# Patient Record
Sex: Female | Born: 1980 | Race: Black or African American | Hispanic: No | Marital: Married | Smoking: Never smoker
Health system: Southern US, Community
[De-identification: ages and names within clinical notes are randomized; demographics above are authoritative.]

## PROBLEM LIST (undated history)

## (undated) ENCOUNTER — Inpatient Hospital Stay (HOSPITAL_COMMUNITY): Payer: Self-pay

## (undated) DIAGNOSIS — R000 Tachycardia, unspecified: Secondary | ICD-10-CM

## (undated) DIAGNOSIS — O24419 Gestational diabetes mellitus in pregnancy, unspecified control: Secondary | ICD-10-CM

## (undated) DIAGNOSIS — I10 Essential (primary) hypertension: Secondary | ICD-10-CM

## (undated) DIAGNOSIS — D72819 Decreased white blood cell count, unspecified: Secondary | ICD-10-CM

## (undated) DIAGNOSIS — R42 Dizziness and giddiness: Secondary | ICD-10-CM

## (undated) DIAGNOSIS — O139 Gestational [pregnancy-induced] hypertension without significant proteinuria, unspecified trimester: Secondary | ICD-10-CM

## (undated) HISTORY — DX: Decreased white blood cell count, unspecified: D72.819

## (undated) HISTORY — PX: NO PAST SURGERIES: SHX2092

---

## 2001-09-08 ENCOUNTER — Emergency Department (HOSPITAL_COMMUNITY): Admission: EM | Admit: 2001-09-08 | Discharge: 2001-09-08 | Payer: Self-pay | Admitting: Emergency Medicine

## 2002-09-03 ENCOUNTER — Emergency Department (HOSPITAL_COMMUNITY): Admission: EM | Admit: 2002-09-03 | Discharge: 2002-09-04 | Payer: Self-pay | Admitting: Emergency Medicine

## 2003-06-20 ENCOUNTER — Emergency Department (HOSPITAL_COMMUNITY): Admission: EM | Admit: 2003-06-20 | Discharge: 2003-06-21 | Payer: Self-pay | Admitting: Emergency Medicine

## 2003-12-07 ENCOUNTER — Emergency Department (HOSPITAL_COMMUNITY): Admission: EM | Admit: 2003-12-07 | Discharge: 2003-12-07 | Payer: Self-pay | Admitting: Emergency Medicine

## 2004-12-25 ENCOUNTER — Emergency Department (HOSPITAL_COMMUNITY): Admission: EM | Admit: 2004-12-25 | Discharge: 2004-12-25 | Payer: Self-pay | Admitting: Emergency Medicine

## 2005-01-05 ENCOUNTER — Emergency Department (HOSPITAL_COMMUNITY): Admission: EM | Admit: 2005-01-05 | Discharge: 2005-01-05 | Payer: Self-pay | Admitting: Emergency Medicine

## 2005-02-23 ENCOUNTER — Ambulatory Visit (HOSPITAL_COMMUNITY): Admission: RE | Admit: 2005-02-23 | Discharge: 2005-02-23 | Payer: Self-pay | Admitting: Chiropractic Medicine

## 2005-11-30 ENCOUNTER — Emergency Department (HOSPITAL_COMMUNITY): Admission: EM | Admit: 2005-11-30 | Discharge: 2005-11-30 | Payer: Self-pay | Admitting: Emergency Medicine

## 2007-11-14 ENCOUNTER — Emergency Department (HOSPITAL_COMMUNITY): Admission: EM | Admit: 2007-11-14 | Discharge: 2007-11-14 | Payer: Self-pay | Admitting: Emergency Medicine

## 2008-04-25 ENCOUNTER — Emergency Department (HOSPITAL_COMMUNITY): Admission: EM | Admit: 2008-04-25 | Discharge: 2008-04-25 | Payer: Self-pay | Admitting: Emergency Medicine

## 2008-06-29 ENCOUNTER — Emergency Department (HOSPITAL_COMMUNITY): Admission: EM | Admit: 2008-06-29 | Discharge: 2008-06-29 | Payer: Self-pay | Admitting: Emergency Medicine

## 2008-08-27 ENCOUNTER — Emergency Department (HOSPITAL_COMMUNITY): Admission: EM | Admit: 2008-08-27 | Discharge: 2008-08-27 | Payer: Self-pay | Admitting: Emergency Medicine

## 2008-11-19 ENCOUNTER — Emergency Department (HOSPITAL_COMMUNITY): Admission: EM | Admit: 2008-11-19 | Discharge: 2008-11-20 | Payer: Self-pay | Admitting: Emergency Medicine

## 2008-12-29 ENCOUNTER — Emergency Department (HOSPITAL_COMMUNITY): Admission: EM | Admit: 2008-12-29 | Discharge: 2008-12-29 | Payer: Self-pay | Admitting: Emergency Medicine

## 2009-08-07 ENCOUNTER — Emergency Department (HOSPITAL_COMMUNITY): Admission: EM | Admit: 2009-08-07 | Discharge: 2009-08-07 | Payer: Self-pay | Admitting: Emergency Medicine

## 2009-10-08 ENCOUNTER — Inpatient Hospital Stay (HOSPITAL_COMMUNITY): Admission: AD | Admit: 2009-10-08 | Discharge: 2009-10-08 | Payer: Self-pay | Admitting: Obstetrics and Gynecology

## 2009-10-08 ENCOUNTER — Ambulatory Visit: Payer: Self-pay | Admitting: Gynecology

## 2009-10-08 ENCOUNTER — Ambulatory Visit: Payer: Self-pay | Admitting: Obstetrics and Gynecology

## 2009-10-10 ENCOUNTER — Inpatient Hospital Stay (HOSPITAL_COMMUNITY): Admission: AD | Admit: 2009-10-10 | Discharge: 2009-10-10 | Payer: Self-pay | Admitting: Obstetrics & Gynecology

## 2009-10-10 ENCOUNTER — Ambulatory Visit: Payer: Self-pay | Admitting: Nurse Practitioner

## 2009-10-17 ENCOUNTER — Ambulatory Visit: Payer: Self-pay | Admitting: Nurse Practitioner

## 2009-10-17 ENCOUNTER — Inpatient Hospital Stay (HOSPITAL_COMMUNITY): Admission: RE | Admit: 2009-10-17 | Discharge: 2009-10-17 | Payer: Self-pay | Admitting: Obstetrics & Gynecology

## 2009-11-08 ENCOUNTER — Ambulatory Visit: Payer: Self-pay | Admitting: Nurse Practitioner

## 2009-12-08 ENCOUNTER — Inpatient Hospital Stay (HOSPITAL_COMMUNITY): Admission: AD | Admit: 2009-12-08 | Discharge: 2009-11-08 | Payer: Self-pay | Admitting: Obstetrics & Gynecology

## 2010-03-14 LAB — URINALYSIS, ROUTINE W REFLEX MICROSCOPIC
Bilirubin Urine: NEGATIVE
Glucose, UA: NEGATIVE mg/dL
Hgb urine dipstick: NEGATIVE
Ketones, ur: NEGATIVE mg/dL
Nitrite: NEGATIVE
Protein, ur: NEGATIVE mg/dL
Specific Gravity, Urine: 1.02 (ref 1.005–1.030)
Urobilinogen, UA: 0.2 mg/dL (ref 0.0–1.0)
pH: 7 (ref 5.0–8.0)

## 2010-03-16 LAB — URINALYSIS, ROUTINE W REFLEX MICROSCOPIC
Bilirubin Urine: NEGATIVE
Glucose, UA: NEGATIVE mg/dL
Hgb urine dipstick: NEGATIVE
Ketones, ur: NEGATIVE mg/dL
Nitrite: NEGATIVE
Protein, ur: NEGATIVE mg/dL
Specific Gravity, Urine: 1.01 (ref 1.005–1.030)
Urobilinogen, UA: 0.2 mg/dL (ref 0.0–1.0)
pH: 5.5 (ref 5.0–8.0)

## 2010-03-16 LAB — CBC
HCT: 38.9 % (ref 36.0–46.0)
Hemoglobin: 13.3 g/dL (ref 12.0–15.0)
MCH: 34.1 pg — ABNORMAL HIGH (ref 26.0–34.0)
MCHC: 34.2 g/dL (ref 30.0–36.0)
MCV: 99.7 fL (ref 78.0–100.0)
Platelets: 177 10*3/uL (ref 150–400)
RBC: 3.91 MIL/uL (ref 3.87–5.11)
RDW: 13.2 % (ref 11.5–15.5)
WBC: 6.8 10*3/uL (ref 4.0–10.5)

## 2010-03-16 LAB — WET PREP, GENITAL
Clue Cells Wet Prep HPF POC: NONE SEEN
Trich, Wet Prep: NONE SEEN
Yeast Wet Prep HPF POC: NONE SEEN

## 2010-03-16 LAB — HCG, QUANTITATIVE, PREGNANCY
hCG, Beta Chain, Quant, S: 243 m[IU]/mL — ABNORMAL HIGH (ref <5)
hCG, Beta Chain, Quant, S: 94 m[IU]/mL — ABNORMAL HIGH (ref <5)

## 2010-03-16 LAB — GC/CHLAMYDIA PROBE AMP, GENITAL
Chlamydia, DNA Probe: NEGATIVE
GC Probe Amp, Genital: NEGATIVE

## 2010-03-16 LAB — POCT PREGNANCY, URINE: Preg Test, Ur: POSITIVE

## 2010-03-16 LAB — ABO/RH: ABO/RH(D): O POS

## 2010-03-17 LAB — URINALYSIS, ROUTINE W REFLEX MICROSCOPIC
Bilirubin Urine: NEGATIVE
Glucose, UA: NEGATIVE mg/dL
Ketones, ur: NEGATIVE mg/dL
Nitrite: NEGATIVE
Protein, ur: NEGATIVE mg/dL
Specific Gravity, Urine: 1.025 (ref 1.005–1.030)
Urobilinogen, UA: 1 mg/dL (ref 0.0–1.0)
pH: 6.5 (ref 5.0–8.0)

## 2010-03-17 LAB — URINE CULTURE
Colony Count: NO GROWTH
Culture  Setup Time: 201108080004
Culture: NO GROWTH

## 2010-03-17 LAB — URINE MICROSCOPIC-ADD ON

## 2010-03-17 LAB — POCT PREGNANCY, URINE: Preg Test, Ur: NEGATIVE

## 2010-04-03 LAB — POCT I-STAT, CHEM 8
BUN: 8 mg/dL (ref 6–23)
Calcium, Ion: 1.12 mmol/L (ref 1.12–1.32)
Chloride: 105 mEq/L (ref 96–112)
Creatinine, Ser: 0.6 mg/dL (ref 0.4–1.2)
Glucose, Bld: 126 mg/dL — ABNORMAL HIGH (ref 70–99)
HCT: 40 % (ref 36.0–46.0)
Hemoglobin: 13.6 g/dL (ref 12.0–15.0)
Potassium: 3.3 mEq/L — ABNORMAL LOW (ref 3.5–5.1)
Sodium: 139 mEq/L (ref 135–145)
TCO2: 24 mmol/L (ref 0–100)

## 2010-04-03 LAB — POCT PREGNANCY, URINE: Preg Test, Ur: NEGATIVE

## 2010-04-05 LAB — POCT I-STAT, CHEM 8
BUN: 11 mg/dL (ref 6–23)
Calcium, Ion: 1.16 mmol/L (ref 1.12–1.32)
Chloride: 105 mEq/L (ref 96–112)
Glucose, Bld: 99 mg/dL (ref 70–99)
HCT: 40 % (ref 36.0–46.0)
Potassium: 3.8 mEq/L (ref 3.5–5.1)

## 2010-04-05 LAB — URINALYSIS, ROUTINE W REFLEX MICROSCOPIC
Bilirubin Urine: NEGATIVE
Glucose, UA: NEGATIVE mg/dL
Ketones, ur: NEGATIVE mg/dL
Nitrite: NEGATIVE
Specific Gravity, Urine: 1.008 (ref 1.005–1.030)
pH: 8 (ref 5.0–8.0)

## 2010-04-05 LAB — PREGNANCY, URINE: Preg Test, Ur: NEGATIVE

## 2010-04-08 LAB — URINALYSIS, ROUTINE W REFLEX MICROSCOPIC
Glucose, UA: NEGATIVE mg/dL
Ketones, ur: NEGATIVE mg/dL
Nitrite: NEGATIVE
Specific Gravity, Urine: 1.019 (ref 1.005–1.030)
pH: 6 (ref 5.0–8.0)

## 2010-04-08 LAB — URINE MICROSCOPIC-ADD ON

## 2010-04-08 LAB — URINE CULTURE

## 2010-04-08 LAB — POCT PREGNANCY, URINE: Preg Test, Ur: NEGATIVE

## 2010-04-10 LAB — URINALYSIS, ROUTINE W REFLEX MICROSCOPIC
Nitrite: POSITIVE — AB
Specific Gravity, Urine: 1.026 (ref 1.005–1.030)
Urobilinogen, UA: 4 mg/dL — ABNORMAL HIGH (ref 0.0–1.0)
pH: 6.5 (ref 5.0–8.0)

## 2010-04-10 LAB — URINE MICROSCOPIC-ADD ON

## 2010-04-10 LAB — URINE CULTURE

## 2010-04-10 LAB — POCT PREGNANCY, URINE: Preg Test, Ur: NEGATIVE

## 2010-05-17 ENCOUNTER — Other Ambulatory Visit: Payer: Self-pay | Admitting: Obstetrics

## 2010-06-16 ENCOUNTER — Inpatient Hospital Stay (HOSPITAL_COMMUNITY)
Admission: AD | Admit: 2010-06-16 | Discharge: 2010-06-16 | Disposition: A | Discharge status: Home / Self Care | Payer: 59 | Source: Ambulatory Visit | Attending: Obstetrics & Gynecology | Admitting: Obstetrics & Gynecology

## 2010-06-16 DIAGNOSIS — O479 False labor, unspecified: Secondary | ICD-10-CM | POA: Insufficient documentation

## 2010-06-17 ENCOUNTER — Inpatient Hospital Stay (HOSPITAL_COMMUNITY)
Admission: AD | Admit: 2010-06-17 | Discharge: 2010-06-17 | Disposition: A | Discharge status: Home / Self Care | Payer: 59 | Source: Ambulatory Visit | Attending: Obstetrics & Gynecology | Admitting: Obstetrics & Gynecology

## 2010-06-17 DIAGNOSIS — O479 False labor, unspecified: Secondary | ICD-10-CM | POA: Insufficient documentation

## 2010-06-18 ENCOUNTER — Inpatient Hospital Stay (HOSPITAL_COMMUNITY)
Admission: AD | Admit: 2010-06-18 | Discharge: 2010-06-20 | DRG: 774 | Disposition: A | Discharge status: Home / Self Care | Payer: 59 | Source: Ambulatory Visit | Attending: Obstetrics & Gynecology | Admitting: Obstetrics & Gynecology

## 2010-06-18 DIAGNOSIS — O1002 Pre-existing essential hypertension complicating childbirth: Principal | ICD-10-CM | POA: Diagnosis present

## 2010-06-18 DIAGNOSIS — O99814 Abnormal glucose complicating childbirth: Secondary | ICD-10-CM | POA: Diagnosis present

## 2010-06-18 LAB — COMPREHENSIVE METABOLIC PANEL
ALT: 10 U/L (ref 0–35)
AST: 23 U/L (ref 0–37)
Albumin: 2.9 g/dL — ABNORMAL LOW (ref 3.5–5.2)
Calcium: 10 mg/dL (ref 8.4–10.5)
Chloride: 95 mEq/L — ABNORMAL LOW (ref 96–112)
Creatinine, Ser: 0.6 mg/dL (ref 0.50–1.10)
Sodium: 126 mEq/L — ABNORMAL LOW (ref 135–145)
Total Bilirubin: 0.5 mg/dL (ref 0.3–1.2)

## 2010-06-18 LAB — CBC
MCV: 84.2 fL (ref 78.0–100.0)
Platelets: 255 10*3/uL (ref 150–400)
RDW: 18.6 % — ABNORMAL HIGH (ref 11.5–15.5)
WBC: 12.2 10*3/uL — ABNORMAL HIGH (ref 4.0–10.5)

## 2010-06-18 LAB — GLUCOSE, CAPILLARY: Glucose-Capillary: 113 mg/dL — ABNORMAL HIGH (ref 70–99)

## 2010-06-18 LAB — RPR: RPR Ser Ql: NONREACTIVE

## 2010-06-19 LAB — GLUCOSE, CAPILLARY: Glucose-Capillary: 101 mg/dL — ABNORMAL HIGH (ref 70–99)

## 2010-06-19 LAB — CBC
Platelets: 193 10*3/uL (ref 150–400)
RBC: 2.78 MIL/uL — ABNORMAL LOW (ref 3.87–5.11)
WBC: 20.2 10*3/uL — ABNORMAL HIGH (ref 4.0–10.5)

## 2010-06-26 NOTE — H&P (Signed)
  Penny Morrison             ACCOUNT NO.:  [id number]  MEDICAL RECORD NO.:  [id number]  LOCATION:  9114                          FACILITY:  [facility]  PHYSICIAN:  Penny Morrison, Penny MorrisonDATE OF BIRTH:  1980-09-13  DATE OF ADMISSION:  06/18/2010 DATE OF DISCHARGE:                             HISTORY & PHYSICAL   CHIEF COMPLAINT:  The patient is a 30 year old para 0 with an estimated date of confinement of June 18, 2010, complaining of contractions.  HISTORY OF PRESENT ILLNESS:  Please see the above.  The patient has had contractions for several days.  She has had multiple presentations to the maternity admissions unit.  On previous exams, the cervix has been 1 cm dilated.  She denies rupture of membranes.  ALLERGIES:  No known drug allergies.  MEDICATIONS:  Prenatal vitamins.  OB RISK FACTORS:  Chronic hypertension.  Gestational diabetes, oral agents.  PRENATAL LABORATORY DATA:  In January 2012, SGPT 81, SGOT 66, creatinine clearance 165 mL/minute, January 2012.  A 2-hour GTT normal.  Hepatitis B surface antigen negative.  Hematocrit 32.4, hemoglobin 10.3, HIV nonreactive.  Quad screen negative.  Platelets 205,000.  Blood type is O positive.  Antibody screen negative.  RPR is nonreactive, rubella immune.  Sickle cell negative.  Uric acid 3.2 on January 03, 2010.  Urine culture and sensitivity no growth.  PAST GYNECOLOGIC HISTORY:  Noncontributory.  PAST MEDICAL HISTORY:  Please see the above.  PAST SURGICAL HISTORY:  No previous surgery.  SOCIAL HISTORY:  She is unemployed, married, living with her spouse. Does not give any significant history of alcohol usage, has no significant smoking history.  Denies illicit drug use.  FAMILY HISTORY:  Remarkable for adult-onset diabetes, hypertension, hypercholesterolemia.  REVIEW OF SYSTEMS:  GU:  Please see the above.  PHYSICAL EXAMINATION:  VITAL SIGNS:  Stable, afebrile.  Fetal heart tracing baseline 140, moderate  long-term variability.  Tocodynamometer, uterine contractions every 2-4 minutes. GENERAL:  Moderate distress. ABDOMEN:  Gravid.  Sterile vaginal exam per the RN, the cervix is 6 cm dilated.  ASSESSMENT:  Nullipara at term, pregnancy complicated by history of chronic hypertension and gestational diabetes on oral agent.  Category 1, fetal heart tracing, active labor.  PLAN:  Admission, we will check CBG, to continue the oral labetalol. Monitor progress, anticipate a spontaneous vaginal delivery.     Penny Morrison, M.D.     Penny Morrison  D:  06/18/2010  T:  06/18/2010  Job:  [id number]  Electronically Signed by Penny Char M.D. on 06/26/2010 10:51:08 AM

## 2010-10-03 LAB — URINALYSIS, ROUTINE W REFLEX MICROSCOPIC
Ketones, ur: NEGATIVE
Nitrite: NEGATIVE
Protein, ur: NEGATIVE
Urobilinogen, UA: 0.2

## 2010-10-03 LAB — POCT I-STAT, CHEM 8
BUN: 11
Chloride: 104
Sodium: 139
TCO2: 22

## 2010-10-03 LAB — PREGNANCY, URINE: Preg Test, Ur: NEGATIVE

## 2010-10-03 LAB — DIFFERENTIAL
Basophils Absolute: 0
Eosinophils Relative: 2
Lymphocytes Relative: 49 — ABNORMAL HIGH
Neutro Abs: 3.1

## 2010-10-03 LAB — CBC
Platelets: 166
RDW: 13.1

## 2010-10-24 ENCOUNTER — Emergency Department (HOSPITAL_COMMUNITY)
Admission: EM | Admit: 2010-10-24 | Discharge: 2010-10-24 | Disposition: A | Discharge status: Home / Self Care | Payer: 59 | Attending: Emergency Medicine | Admitting: Emergency Medicine

## 2010-10-24 DIAGNOSIS — R55 Syncope and collapse: Secondary | ICD-10-CM | POA: Insufficient documentation

## 2010-10-24 LAB — URINALYSIS, ROUTINE W REFLEX MICROSCOPIC
Bilirubin Urine: NEGATIVE
Nitrite: NEGATIVE
Specific Gravity, Urine: 1.002 — ABNORMAL LOW (ref 1.005–1.030)
Urobilinogen, UA: 0.2 mg/dL (ref 0.0–1.0)
pH: 7 (ref 5.0–8.0)

## 2010-10-24 LAB — POCT PREGNANCY, URINE: Preg Test, Ur: NEGATIVE

## 2010-10-24 LAB — URINE MICROSCOPIC-ADD ON

## 2010-10-27 LAB — URINE CULTURE: Culture  Setup Time: 201210240147

## 2011-04-03 DIAGNOSIS — I498 Other specified cardiac arrhythmias: Secondary | ICD-10-CM | POA: Insufficient documentation

## 2011-04-04 ENCOUNTER — Encounter (HOSPITAL_COMMUNITY): Payer: Self-pay

## 2011-04-04 ENCOUNTER — Emergency Department (HOSPITAL_COMMUNITY)
Admission: EM | Admit: 2011-04-04 | Discharge: 2011-04-04 | Disposition: A | Discharge status: Home / Self Care | Payer: 59 | Attending: Emergency Medicine | Admitting: Emergency Medicine

## 2011-04-04 ENCOUNTER — Other Ambulatory Visit: Payer: Self-pay

## 2011-04-04 DIAGNOSIS — R000 Tachycardia, unspecified: Secondary | ICD-10-CM

## 2011-04-04 HISTORY — DX: Dizziness and giddiness: R42

## 2011-04-04 LAB — BASIC METABOLIC PANEL
CO2: 26 mEq/L (ref 19–32)
Chloride: 101 mEq/L (ref 96–112)
Creatinine, Ser: 0.72 mg/dL (ref 0.50–1.10)
GFR calc Af Amer: >90 mL/min (ref >90)
Potassium: 3.7 mEq/L (ref 3.5–5.1)
Sodium: 135 mEq/L (ref 135–145)

## 2011-04-04 LAB — TSH: TSH: 1.324 u[IU]/mL (ref 0.350–4.500)

## 2011-04-04 NOTE — ED Notes (Signed)
Pt presents with a c/c of tachycardia.  Pt st's she had a scare earlier and ever since then her heart rate has been elevated.  Pt's heart doctor (Dr. Jacinto Halim) told her to come back in and have an EKG done the next time she becomes tachycardic.  Pt is in sinus tach.  No SOB, no chest pain, no dizziness, no lightheadedness, cap refill < 3 seconds.

## 2011-04-04 NOTE — Discharge Instructions (Signed)
Please follow up with your cardiologist.  Avoid caffeine and other stimulants.  Return to the ER for chest pain, shortness of breath, or other concerning symptoms.  Tachycardia, Nonspecific In adults, the heart normally beats between 60 and 100 times a minute. A heart rate over 100 is called tachycardia. When your heart beats too fast, it may not be able to pump enough blood to the rest of the body. CAUSES   Exercise or exertion.   Fever.   Pain or injury.   Infection.   Loss of fluid (dehydration).   Overactive thyroid.   Lack of red blood cells (anemia).   Anxiety.   Alcohol.   Heart arrhythmia.   Caffeine.   Tobacco products.   Diet pills.   Street drugs.   Heart disease.  SYMPTOMS  Palpitations (rapid or irregular heartbeat).   Dizziness.   Tiredness (fatigue).   Shortness of breath.  DIAGNOSIS  After an exam and taking a history, your caregiver may order:  Blood tests.   Electrocardiogram (EKG).   Heart monitor.  TREATMENT  Treatment will depend on the cause and potential for harm. It may include:  Intravenous (IV) replacement of fluids or blood.   Antidote or reversal medicines.   Changes in your present medicines.   Lifestyle changes.  HOME CARE INSTRUCTIONS   Get rest.   Drink enough water and fluids to keep your urine clear or pale yellow.   Avoid:   Caffeine.   Nicotine.   Alcohol.   Stress.   Chocolate.   Stimulants.   Only take medicine as directed by your caregiver.  SEEK IMMEDIATE MEDICAL CARE IF:   You have pain in your chest, upper arms, jaw, or neck.   You become weak, dizzy, or feel faint.   You have palpitations that will not go away.   You throw up (vomit), have diarrhea, or pass blood.   You look pale and your skin is cool and wet.  MAKE SURE YOU:   Understand these instructions.   Will watch your condition.   Will get help right away if you are not doing well or get worse.  Document Released:  01/26/2004 Document Revised: 12/07/2010 Document Reviewed: 11/28/2010 [facility] Patient Information 2012 [place], [place].

## 2011-04-04 NOTE — ED Notes (Signed)
Pt said that something scared her but then an hour later her heart was still racing, she states that shes had tachycardia before

## 2011-04-04 NOTE — ED Provider Notes (Signed)
History     CSN: [id number]  Arrival date & time 04/03/11  2357   First MD Initiated Contact with Patient 04/04/11 0147      Chief Complaint  Patient presents with  . Tachycardia    (Consider location/radiation/quality/duration/timing/severity/associated sxs/prior treatment) HPI 31 year old female presents emergency department with report of fast heart rate. Patient reports earlier this evening she was backing up and hit something. Patient reports being scared by this but noticed some time later after she had calmed down but she still had a racing heartbeat. Patient has been seen for this in the past, is followed by Dr. Jacinto Halim for same. Patient denies any shortness of breath chest pain leg swelling oral contraceptive use history of blood clotting disorders or PE. Patient has had outpatient workup for her tachycardia, but was told to return to her cardiologist if it returned for Holter monitoring. Patient otherwise asymptomatic without any complaints. Patient denies being anxious or panicky at this time Past Medical History  Diagnosis Date  . Vertigo     History reviewed. No pertinent past surgical history.  History reviewed. No pertinent family history.  History  Substance Use Topics  . Smoking status: Not on file  . Smokeless tobacco: Not on file  . Alcohol Use: No    OB History    Grav Para Term Preterm Abortions TAB SAB Ect Mult Living                  Review of Systems  All other systems reviewed and are negative.   other than listed in history of present illness  Allergies  Review of patient's allergies indicates no known allergies.  Home Medications   Current Outpatient Rx  Name Route Sig Dispense Refill  . MECLIZINE HCL 25 MG PO TABS Oral Take 25 mg by mouth 3 (three) times daily as needed.      BP 129/86  Pulse 110  Temp(Src) 97.8 F (36.6 C) (Oral)  Resp 25  Wt 130 lb (58.968 kg)  SpO2 100%  LMP 03/22/2011  Physical Exam  Nursing note and vitals  reviewed. Constitutional: She is oriented to person, place, and time. She appears well-developed and well-nourished.  HENT:  Head: Normocephalic and atraumatic.  Nose: Nose normal.  Mouth/Throat: Oropharynx is clear and moist.  Eyes: Conjunctivae and EOM are normal. Pupils are equal, round, and reactive to light.  Neck: Normal range of motion. Neck supple. No JVD present. No tracheal deviation present. No thyromegaly present.  Cardiovascular: Regular rhythm, normal heart sounds and intact distal pulses.  Exam reveals no gallop and no friction rub.   No murmur heard.      Tachycardia noted  Pulmonary/Chest: Effort normal and breath sounds normal. No stridor. No respiratory distress. She has no wheezes. She has no rales. She exhibits no tenderness.  Abdominal: Soft. Bowel sounds are normal. She exhibits no distension and no mass. There is no tenderness. There is no rebound and no guarding.  Musculoskeletal: Normal range of motion. She exhibits no edema and no tenderness.  Lymphadenopathy:    She has no cervical adenopathy.  Neurological: She is oriented to person, place, and time. She exhibits normal muscle tone. Coordination normal.  Skin: Skin is dry. No rash noted. No erythema. No pallor.  Psychiatric: She has a normal mood and affect. Her behavior is normal. Judgment and thought content normal.    ED Course  Procedures (including critical care time)  Labs Reviewed  BASIC METABOLIC PANEL - Abnormal; Notable for  the following:    Glucose, Bld 108 (*)    All other components within normal limits  TSH   No results found.  Date: 04/04/2011  Rate: 116  Rhythm: sinus tachycardia  QRS Axis: normal  Intervals: PR prolonged  ST/T Wave abnormalities: nonspecific T wave changes  Conduction Disutrbances:first-degree A-V block   Narrative Interpretation: early repolarization  Old EKG Reviewed: changes noted. Rate has increased   1. Sinus tachycardia       MDM  31 year old female  with sinus tachycardia without other symptoms. Do not feel this is secondary to PE. Patient has close followup establish with cardiology. We'll get baseline labs, and have her followup instructed        Olivia Mackie, MD 04/04/11 [phone]

## 2011-05-04 ENCOUNTER — Other Ambulatory Visit: Payer: Self-pay | Admitting: Obstetrics

## 2011-06-15 ENCOUNTER — Ambulatory Visit
Admission: RE | Admit: 2011-06-15 | Discharge: 2011-06-15 | Disposition: A | Discharge status: Home / Self Care | Payer: 59 | Source: Ambulatory Visit | Attending: Family Medicine | Admitting: Family Medicine

## 2011-06-15 ENCOUNTER — Other Ambulatory Visit: Payer: Self-pay | Admitting: Family Medicine

## 2011-06-15 DIAGNOSIS — M542 Cervicalgia: Secondary | ICD-10-CM

## 2011-07-23 ENCOUNTER — Other Ambulatory Visit (HOSPITAL_COMMUNITY)
Admission: RE | Admit: 2011-07-23 | Discharge: 2011-07-23 | Disposition: A | Discharge status: Home / Self Care | Payer: 59 | Source: Ambulatory Visit | Attending: Family Medicine | Admitting: Family Medicine

## 2011-07-23 DIAGNOSIS — Z0000 Encounter for general adult medical examination without abnormal findings: Secondary | ICD-10-CM | POA: Insufficient documentation

## 2012-01-31 ENCOUNTER — Inpatient Hospital Stay (HOSPITAL_COMMUNITY)
Admission: AD | Admit: 2012-01-31 | Discharge: 2012-01-31 | Disposition: A | Discharge status: Home / Self Care | Payer: 59 | Source: Ambulatory Visit | Attending: Obstetrics & Gynecology | Admitting: Obstetrics & Gynecology

## 2012-01-31 ENCOUNTER — Encounter (HOSPITAL_COMMUNITY): Payer: Self-pay | Admitting: *Deleted

## 2012-01-31 ENCOUNTER — Inpatient Hospital Stay (HOSPITAL_COMMUNITY): Payer: 59

## 2012-01-31 DIAGNOSIS — N946 Dysmenorrhea, unspecified: Secondary | ICD-10-CM | POA: Insufficient documentation

## 2012-01-31 DIAGNOSIS — N920 Excessive and frequent menstruation with regular cycle: Secondary | ICD-10-CM | POA: Insufficient documentation

## 2012-01-31 DIAGNOSIS — R1032 Left lower quadrant pain: Secondary | ICD-10-CM | POA: Insufficient documentation

## 2012-01-31 HISTORY — DX: Gestational diabetes mellitus in pregnancy, unspecified control: O24.419

## 2012-01-31 HISTORY — DX: Tachycardia, unspecified: R00.0

## 2012-01-31 LAB — WET PREP, GENITAL: Clue Cells Wet Prep HPF POC: NONE SEEN

## 2012-01-31 LAB — CBC
HCT: 36.7 % (ref 36.0–46.0)
Hemoglobin: 12.1 g/dL (ref 12.0–15.0)
MCHC: 33 g/dL (ref 30.0–36.0)
RDW: 13 % (ref 11.5–15.5)
WBC: 5 10*3/uL (ref 4.0–10.5)

## 2012-01-31 LAB — URINALYSIS, ROUTINE W REFLEX MICROSCOPIC
Bilirubin Urine: NEGATIVE
Leukocytes, UA: NEGATIVE
Nitrite: NEGATIVE
Specific Gravity, Urine: 1.01 (ref 1.005–1.030)
Urobilinogen, UA: 0.2 mg/dL (ref 0.0–1.0)
pH: 6 (ref 5.0–8.0)

## 2012-01-31 LAB — URINE MICROSCOPIC-ADD ON

## 2012-01-31 MED ORDER — NORGESTIMATE-ETH ESTRADIOL 0.25-35 MG-MCG PO TABS: 1.0000 | ORAL_TABLET | Freq: Every day | ORAL | Status: DC

## 2012-01-31 NOTE — MAU Note (Signed)
Pt states for past months and a half has had stabbing pain into her lower abdomen, had cycle at end of December, came on again on 01/10/2012 through 01/15/2012, then started bleeding again yesterday. Notes intermittent clear vaginal discharge with no odor.

## 2012-01-31 NOTE — MAU Provider Note (Signed)
History     CSN: [id number]  Arrival date and time: 01/31/12 1318   First Provider Initiated Contact with Patient 01/31/12 1403      No chief complaint on file.  HPI 32 y.o. G1P1001 presents with 2 months of LLQ stabing pain with shortened length of time between her last three cycles.  Her cycles are usually 28 days apart but she has had her period recently start on Dec. 22nd, Jan 9th, and Jan 29.  Bleeding has been normal compared to her usual cycle bleeding.  Denies any other vaginal discharge, foul odor, change in bowel habit, urinary frequency or dysuria, feeling of pelvic fullness, abnormal hair growth, dizziness, fever, or chills.  Last sexual activity was yesterday with some mild painful deep penetration.  She does state that she has been under some abnormal stress lately with a death in her family.   OB History    Grav Para Term Preterm Abortions TAB SAB Ect Mult Living   1 1 1       1       Past Medical History  Diagnosis Date  . Vertigo   . Tachycardia   . Gestational diabetes     Past Surgical History  Procedure Date  . No past surgeries     History reviewed. No pertinent family history.  History  Substance Use Topics  . Smoking status: Never Smoker   . Smokeless tobacco: Not on file  . Alcohol Use: No    Allergies: No Known Allergies  Prescriptions prior to admission  Medication Sig Dispense Refill  . acebutolol (SECTRAL) 200 MG capsule Take 200 mg by mouth at bedtime.      Marland Kitchen acetaminophen-codeine (TYLENOL #3) 300-30 MG per tablet Take 1 tablet by mouth every 4 (four) hours as needed. For pain      . ALPRAZolam (XANAX) 0.25 MG tablet Take 0.25 mg by mouth daily as needed. For anxiety      . cholecalciferol (VITAMIN D) 1000 UNITS tablet Take 1,000 Units by mouth at bedtime.      . Omega-3 Fatty Acids (FISH OIL) 1200 MG CAPS Take 1 capsule by mouth at bedtime.      . meclizine (ANTIVERT) 25 MG tablet Take 25 mg by mouth 3 (three) times daily as needed.         Review of Systems  Constitutional: Negative.  Negative for fever and chills.  HENT: Negative.   Eyes: Negative.   Respiratory: Negative.   Cardiovascular: Negative.   Gastrointestinal: Positive for abdominal pain. Negative for diarrhea, constipation and blood in stool.  Genitourinary: Negative for dysuria, urgency and frequency.  Musculoskeletal: Negative.   Skin: Negative.  Negative for itching and rash.  Neurological: Negative.  Negative for dizziness and headaches.  Endo/Heme/Allergies: Negative.   Psychiatric/Behavioral: Negative.    Physical Exam   Pulse 94, temperature 97.8 F (36.6 C), temperature source Oral, resp. rate 18, height 5\' 5"  (1.651 m), weight 55.792 kg (123 lb), last menstrual period 01/30/2012.  Physical Exam  Constitutional: She appears well-developed and well-nourished. No distress.  Eyes: Conjunctivae normal and EOM are normal. Pupils are equal, round, and reactive to light.  Neck: Normal range of motion. Neck supple.  Cardiovascular: Normal rate, regular rhythm, normal heart sounds and intact distal pulses.   Respiratory: Effort normal and breath sounds normal.  GI: Soft. Bowel sounds are normal. She exhibits no distension and no mass. There is no tenderness. There is no rebound and no guarding.  Genitourinary: There is  no rash, tenderness, lesion or injury on the right labia. There is no rash, tenderness, lesion or injury on the left labia. Uterus is not deviated, not enlarged, not fixed and not tender. Cervix exhibits no motion tenderness, no discharge and no friability. Right adnexum displays tenderness. Right adnexum displays no mass and no fullness. Left adnexum displays no mass, no tenderness and no fullness. There is bleeding (menses) around the vagina. No erythema or tenderness around the vagina. No foreign body around the vagina. No signs of injury around the vagina. No vaginal discharge found.   Results for orders placed during the hospital  encounter of 01/31/12 (from the past 24 hour(s))  URINALYSIS, ROUTINE W REFLEX MICROSCOPIC     Status: Abnormal   Collection Time   01/31/12  1:32 PM      Component Value Range   Color, Urine YELLOW  YELLOW   APPearance CLEAR  CLEAR   Specific Gravity, Urine 1.010  1.005 - 1.030   pH 6.0  5.0 - 8.0   Glucose, UA NEGATIVE  NEGATIVE mg/dL   Hgb urine dipstick LARGE (*) NEGATIVE   Bilirubin Urine NEGATIVE  NEGATIVE   Ketones, ur NEGATIVE  NEGATIVE mg/dL   Protein, ur NEGATIVE  NEGATIVE mg/dL   Urobilinogen, UA 0.2  0.0 - 1.0 mg/dL   Nitrite NEGATIVE  NEGATIVE   Leukocytes, UA NEGATIVE  NEGATIVE  URINE MICROSCOPIC-ADD ON     Status: Abnormal   Collection Time   01/31/12  1:32 PM      Component Value Range   Squamous Epithelial / LPF FEW (*) RARE   RBC / HPF 11-20  <3 RBC/hpf   Bacteria, UA RARE  RARE  POCT PREGNANCY, URINE     Status: Normal   Collection Time   01/31/12  2:03 PM      Component Value Range   Preg Test, Ur NEGATIVE  NEGATIVE  WET PREP, GENITAL     Status: Abnormal   Collection Time   01/31/12  2:43 PM      Component Value Range   Yeast Wet Prep HPF POC NONE SEEN  NONE SEEN   Trich, Wet Prep NONE SEEN  NONE SEEN   Clue Cells Wet Prep HPF POC NONE SEEN  NONE SEEN   WBC, Wet Prep HPF POC FEW (*) NONE SEEN  CBC     Status: Abnormal   Collection Time   01/31/12  4:23 PM      Component Value Range   WBC 5.0  4.0 - 10.5 K/uL   RBC 3.83 (*) 3.87 - 5.11 MIL/uL   Hemoglobin 12.1  12.0 - 15.0 g/dL   HCT 14.7  82.9 - 56.2 %   MCV 95.8  78.0 - 100.0 fL   MCH 31.6  26.0 - 34.0 pg   MCHC 33.0  30.0 - 36.0 g/dL   RDW 13.0  86.5 - 78.4 %   Platelets 191  150 - 400 K/uL   US Transvaginal Non-ob  01/31/2012  *RADIOLOGY REPORT*  Clinical Data: Irregular menses with left lower quadrant pain.  LMP 01/30/2012  TRANSABDOMINAL AND TRANSVAGINAL ULTRASOUND OF PELVIS Technique:  Both transabdominal and transvaginal ultrasound examinations of the pelvis were performed. Transabdominal  technique was performed for global imaging of the pelvis including uterus, ovaries, adnexal regions, and pelvic cul-de-sac.  It was necessary to proceed with endovaginal exam following the transabdominal exam to visualize the adnexa.  Comparison:  None  Findings:  Uterus: Is anteverted and anteflexed and has  a sagittal length of 7.7 cm, depth of 4.5 cm and width of 5.5 cm.  A homogeneous myometrium is seen  Endometrium: Is thin with a width of 4.1 mm.  No areas of focal thickening or heterogeneity are identified. This would correlate with a postsecretory endometrial stripe and would correspond with the provided LMP of 01/30/2012.  Right ovary:  Measures 2.7 x 3.2 x 3.0 cm and has a normal appearance.  Flow is identified within the stroma with color Doppler assessment  Left ovary: Measures 2.9 x 1.5 x 3.5 cm and has a normal appearance. Flow is identified within the ovarian stroma with color Doppler assessment  Other findings: No pelvic fluid or separate adnexal masses are seen.  IMPRESSION: Normal postsecretory pelvic ultrasound.   Original Report Authenticated By: Rhodia Albright, M.D.    US Pelvis Complete  01/31/2012  *RADIOLOGY REPORT*  Clinical Data: Irregular menses with left lower quadrant pain.  LMP 01/30/2012  TRANSABDOMINAL AND TRANSVAGINAL ULTRASOUND OF PELVIS Technique:  Both transabdominal and transvaginal ultrasound examinations of the pelvis were performed. Transabdominal technique was performed for global imaging of the pelvis including uterus, ovaries, adnexal regions, and pelvic cul-de-sac.  It was necessary to proceed with endovaginal exam following the transabdominal exam to visualize the adnexa.  Comparison:  None  Findings:  Uterus: Is anteverted and anteflexed and has a sagittal length of 7.7 cm, depth of 4.5 cm and width of 5.5 cm.  A homogeneous myometrium is seen  Endometrium: Is thin with a width of 4.1 mm.  No areas of focal thickening or heterogeneity are identified. This would  correlate with a postsecretory endometrial stripe and would correspond with the provided LMP of 01/30/2012.  Right ovary:  Measures 2.7 x 3.2 x 3.0 cm and has a normal appearance.  Flow is identified within the stroma with color Doppler assessment  Left ovary: Measures 2.9 x 1.5 x 3.5 cm and has a normal appearance. Flow is identified within the ovarian stroma with color Doppler assessment  Other findings: No pelvic fluid or separate adnexal masses are seen.  IMPRESSION: Normal postsecretory pelvic ultrasound.   Original Report Authenticated By: Rhodia Albright, M.D.    MAU Course  Procedures UcG Pelvic exam, bi-manual - rt adnexal pain with no mass palpated Pelvic ultrasound   Assessment and Plan  A: 1. Dysmenorrhea   2. Unusually frequent menses    P: D/C home Discussed variations in menstrual cycle with pt r/t hormonal changes, stress, etc.   Ibuprofen OTC for pain Sprintec 28 prescribed with 2 refills F/U with primary care provider and/or Gyn of pt choice (pt mentioned having preferred Gyn) Return to MAU as needed   Henningsgaard, Elige Radon 01/31/2012, 2:34 PM   I have seen this patient and agree with the above resident's note.  Pain with exam mild, onset 2 months ago without change, and no s/sx of infection with exam or labs.   LEFTWICH-KIRBY, Kathyann Spaugh Certified Nurse-Midwife

## 2012-02-01 LAB — GC/CHLAMYDIA PROBE AMP
CT Probe RNA: NEGATIVE
GC Probe RNA: NEGATIVE

## 2012-08-13 ENCOUNTER — Other Ambulatory Visit: Payer: Self-pay | Admitting: Family Medicine

## 2012-08-13 ENCOUNTER — Other Ambulatory Visit (HOSPITAL_COMMUNITY)
Admission: RE | Admit: 2012-08-13 | Discharge: 2012-08-13 | Disposition: A | Discharge status: Home / Self Care | Payer: 59 | Source: Ambulatory Visit | Attending: Family Medicine | Admitting: Family Medicine

## 2012-08-13 DIAGNOSIS — Z0000 Encounter for general adult medical examination without abnormal findings: Secondary | ICD-10-CM | POA: Insufficient documentation

## 2012-10-10 ENCOUNTER — Other Ambulatory Visit: Payer: Self-pay | Admitting: Obstetrics and Gynecology

## 2012-10-10 ENCOUNTER — Other Ambulatory Visit (HOSPITAL_COMMUNITY)
Admission: RE | Admit: 2012-10-10 | Discharge: 2012-10-10 | Disposition: A | Discharge status: Home / Self Care | Payer: 59 | Source: Ambulatory Visit | Attending: Obstetrics and Gynecology | Admitting: Obstetrics and Gynecology

## 2012-10-10 DIAGNOSIS — Z01419 Encounter for gynecological examination (general) (routine) without abnormal findings: Secondary | ICD-10-CM | POA: Insufficient documentation

## 2012-10-10 DIAGNOSIS — Z1151 Encounter for screening for human papillomavirus (HPV): Secondary | ICD-10-CM | POA: Insufficient documentation

## 2012-10-22 ENCOUNTER — Telehealth: Payer: Self-pay | Admitting: Hematology and Oncology

## 2012-10-22 NOTE — Telephone Encounter (Signed)
C/D 10/22/12 for appt. 11/10

## 2012-10-22 NOTE — Telephone Encounter (Signed)
lvom for pt to return call in re to referral.  °

## 2012-10-22 NOTE — Telephone Encounter (Signed)
PT RETURN CALL TO SCHEDULE NP APPT 11/10 @ 9:30 W/DR. GORSUCH REFERRING DR. CAMMIE FULP  DX-MILD CHRONIC LEUKOPENIA WELCOME PACKET MAILED.

## 2012-11-10 ENCOUNTER — Ambulatory Visit (HOSPITAL_BASED_OUTPATIENT_CLINIC_OR_DEPARTMENT_OTHER): Payer: 59 | Admitting: Lab

## 2012-11-10 ENCOUNTER — Telehealth: Payer: Self-pay | Admitting: Hematology and Oncology

## 2012-11-10 ENCOUNTER — Encounter: Payer: Self-pay | Admitting: Hematology and Oncology

## 2012-11-10 ENCOUNTER — Ambulatory Visit: Payer: 59

## 2012-11-10 ENCOUNTER — Ambulatory Visit (HOSPITAL_BASED_OUTPATIENT_CLINIC_OR_DEPARTMENT_OTHER): Payer: 59 | Admitting: Hematology and Oncology

## 2012-11-10 ENCOUNTER — Other Ambulatory Visit (HOSPITAL_COMMUNITY)
Admission: RE | Admit: 2012-11-10 | Discharge: 2012-11-10 | Disposition: A | Discharge status: Home / Self Care | Payer: 59 | Source: Ambulatory Visit | Attending: Hematology and Oncology | Admitting: Hematology and Oncology

## 2012-11-10 VITALS — BP 140/92 | HR 87 | Temp 97.7°F | Resp 18 | Wt 122.6 lb

## 2012-11-10 DIAGNOSIS — D72819 Decreased white blood cell count, unspecified: Secondary | ICD-10-CM | POA: Insufficient documentation

## 2012-11-10 HISTORY — DX: Decreased white blood cell count, unspecified: D72.819

## 2012-11-10 LAB — SEDIMENTATION RATE: Sed Rate: 6 mm/hr (ref 0–22)

## 2012-11-10 LAB — HEPATITIS B SURFACE ANTIBODY,QUALITATIVE: Hep B S Ab: POSITIVE — AB

## 2012-11-10 LAB — CBC WITH DIFFERENTIAL/PLATELET
Basophils Absolute: 0 10*3/uL (ref 0.0–0.1)
Eosinophils Absolute: 0.1 10*3/uL (ref 0.0–0.5)
HCT: 40 % (ref 34.8–46.6)
LYMPH%: 45.7 % (ref 14.0–49.7)
MCV: 97.7 fL (ref 79.5–101.0)
MONO%: 5.9 % (ref 0.0–14.0)
NEUT#: 1.7 10*3/uL (ref 1.5–6.5)
NEUT%: 44.2 % (ref 38.4–76.8)
Platelets: 194 10*3/uL (ref 145–400)
RBC: 4.09 10*6/uL (ref 3.70–5.45)

## 2012-11-10 LAB — COMPREHENSIVE METABOLIC PANEL (CC13)
Albumin: 3.9 g/dL (ref 3.5–5.0)
Anion Gap: 8 mEq/L (ref 3–11)
BUN: 8.3 mg/dL (ref 7.0–26.0)
CO2: 21 mEq/L — ABNORMAL LOW (ref 22–29)
Calcium: 9.6 mg/dL (ref 8.4–10.4)
Chloride: 110 mEq/L — ABNORMAL HIGH (ref 98–109)
Glucose: 88 mg/dl (ref 70–140)
Potassium: 3.9 mEq/L (ref 3.5–5.1)
Sodium: 140 mEq/L (ref 136–145)
Total Protein: 8.2 g/dL (ref 6.4–8.3)

## 2012-11-10 LAB — HEPATITIS B SURFACE ANTIGEN: Hepatitis B Surface Ag: NEGATIVE

## 2012-11-10 NOTE — Progress Notes (Signed)
[facility] CONSULT NOTE  Patient Care Team: Cammie Fulp as PCP - General (Family Medicine) Artis Delay, MD as Consulting Physician (Hematology and Oncology)  CHIEF COMPLAINTS/PURPOSE OF CONSULTATION:  Leukopenia  HISTORY OF PRESENTING ILLNESS:  Penny Morrison 32 y.o. female is here because of leukopenia. I had the opportunity to review her CBC dated back 2 years ago. Her white blood cell count range is from 3.3-6 She denies any history of recurrent infection. She does have history urinary tract infection on and off. Last antibiotic therapy was 6 months ago. She denies any prior diagnosis of autoimmune disease. She's never been transfused before. Denies any atypical infection such as shingles, meningitis, or pneumonia requiring hospitalization  MEDICAL HISTORY:  Past Medical History  Diagnosis Date  . Vertigo   . Tachycardia   . Gestational diabetes   . Leukopenia   . Leukocytopenia, unspecified 11/10/2012    SURGICAL HISTORY: Past Surgical History  Procedure Laterality Date  . No past surgeries      SOCIAL HISTORY: History   Social History  . Marital Status: Married    Spouse Name: N/A    Number of Children: N/A  . Years of Education: N/A   Occupational History  . Not on file.   Social History Main Topics  . Smoking status: Never Smoker   . Smokeless tobacco: Never Used  . Alcohol Use: No  . Drug Use: No  . Sexual Activity: Yes    Birth Control/ Protection: None   Other Topics Concern  . Not on file   Social History Narrative  . No narrative on file    FAMILY HISTORY: Family History  Problem Relation Age of Onset  . Cancer Maternal Grandmother     colon ca    ALLERGIES:  has No Known Allergies.  MEDICATIONS:  Current Outpatient Prescriptions  Medication Sig Dispense Refill  . acebutolol (SECTRAL) 200 MG capsule Take 200 mg by mouth at bedtime.      Marland Kitchen acetaminophen-codeine (TYLENOL #3) 300-30 MG per tablet Take 1 tablet by  mouth every 4 (four) hours as needed. For pain      . ALPRAZolam (XANAX) 0.25 MG tablet Take 0.25 mg by mouth daily as needed. For anxiety      . cholecalciferol (VITAMIN D) 1000 UNITS tablet Take 1,000 Units by mouth at bedtime.      . Linaclotide (LINZESS PO) Take by mouth daily as needed.      . meclizine (ANTIVERT) 25 MG tablet Take 25 mg by mouth 3 (three) times daily as needed.      . medroxyPROGESTERone (DEPO-PROVERA) 150 MG/ML injection Inject 150 mg into the muscle every 3 (three) months.      . Omega-3 Fatty Acids (FISH OIL) 1200 MG CAPS Take 1 capsule by mouth at bedtime.       No current facility-administered medications for this visit.    REVIEW OF SYSTEMS:   Constitutional: Denies fevers, chills or abnormal night sweats Eyes: Denies blurriness of vision, double vision or watery eyes Ears, nose, mouth, throat, and face: Denies mucositis or sore throat Respiratory: Denies cough, dyspnea or wheezes Cardiovascular: Denies palpitation, chest discomfort or lower extremity swelling Gastrointestinal:  Denies nausea, heartburn or change in bowel habits Skin: Denies abnormal skin rashes Lymphatics: Denies new lymphadenopathy or easy bruising Neurological:Denies numbness, tingling or new weaknesses Behavioral/Psych: Mood is stable, no new changes  All other systems were reviewed with the patient and are negative.  PHYSICAL EXAMINATION: ECOG PERFORMANCE STATUS:  0 - Asymptomatic  Filed Vitals:   11/10/12 1024  BP: 140/92  Pulse: 87  Temp: 97.7 F (36.5 C)  Resp: 18   Filed Weights   11/10/12 1024  Weight: 122 lb 9.6 oz (55.611 kg)    GENERAL:alert, no distress and comfortable SKIN: skin color, texture, turgor are normal, no rashes or significant lesions EYES: normal, conjunctiva are pink and non-injected, sclera clear OROPHARYNX:no exudate, no erythema and lips, buccal mucosa, and tongue normal  NECK: supple, thyroid normal size, non-tender, without nodularity LYMPH:  no  palpable lymphadenopathy in the cervical, axillary or inguinal LUNGS: clear to auscultation and percussion with normal breathing effort HEART: regular rate & rhythm and no murmurs and no lower extremity edema ABDOMEN:abdomen soft, non-tender and normal bowel sounds Musculoskeletal:no cyanosis of digits and no clubbing  PSYCH: alert & oriented x 3 with fluent speech NEURO: no focal motor/sensory deficits  LABORATORY DATA:  I have reviewed the data as listed  ASSESSMENT:  Leukopenia  PLAN:  #1 recurrent leukopenia Really, this could be due to her African American heritage. I think it is prudent to rule out autoimmune disease, infection, or leukemia. I will go ahead and order blood work and see her back in 2 weeks to review test results. In the meantime, the patient is asymptomatic. I reassured the patient not to worry as her abnormal blood work dated back 2 years ago Orders Placed This Encounter  Procedures  . CBC with Differential    Standing Status: Future     Number of Occurrences:      Standing Expiration Date: 08/02/2013  . Sedimentation rate    Standing Status: Future     Number of Occurrences:      Standing Expiration Date: 11/10/2013  . ANA w/Reflex if Positive    Standing Status: Future     Number of Occurrences:      Standing Expiration Date: 11/10/2013  . Smear    Standing Status: Future     Number of Occurrences:      Standing Expiration Date: 11/10/2013  . Vitamin B12    Standing Status: Future     Number of Occurrences:      Standing Expiration Date: 11/10/2013  . Comprehensive metabolic panel    Standing Status: Future     Number of Occurrences:      Standing Expiration Date: 11/10/2013  . Hepatitis B surface antibody    Standing Status: Future     Number of Occurrences:      Standing Expiration Date: 11/10/2013  . Hepatitis B surface antigen    Standing Status: Future     Number of Occurrences:      Standing Expiration Date: 11/10/2013  . Hepatitis B  core antibody, IgM    Standing Status: Future     Number of Occurrences:      Standing Expiration Date: 11/10/2013  . HIV antibody    Standing Status: Future     Number of Occurrences:      Standing Expiration Date: 11/10/2013  . Hepatitis C antibody    Standing Status: Future     Number of Occurrences:      Standing Expiration Date: 11/10/2013  . Flow Cytometry    R/o CLL, lymphocytosis on outside labs    Standing Status: Future     Number of Occurrences:      Standing Expiration Date: 11/10/2013    All questions were answered. The patient knows to call the clinic with any problems, questions or concerns.  I spent 25 minutes counseling the patient face to face. The total time spent in the appointment was 40 minutes and more than 50% was on counseling.     Autumn Pruitt, MD 11/10/2012 10:48 AM

## 2012-11-10 NOTE — Telephone Encounter (Signed)
per 11/10 POF added on lab for today and made appt for NG in 2 wks AVS and cal given shh

## 2012-11-10 NOTE — Progress Notes (Signed)
Checked in new pt with no financial concerns. °

## 2012-11-17 ENCOUNTER — Telehealth: Payer: Self-pay | Admitting: *Deleted

## 2012-11-17 NOTE — Telephone Encounter (Signed)
Pt states saw her lab results on My Chart.  She is concerned her Hep B showed positive and her Vit B12 was elevated.  She asks if Dr. Bertis Ruddy can explain what this means for pt and how it affects her.

## 2012-11-17 NOTE — Telephone Encounter (Signed)
I can review with her but if she calls again, high B12 is normal and Hep B positive was due to prior vaccination

## 2012-11-17 NOTE — Telephone Encounter (Signed)
Informed pt of Dr. Gorsuch's reply below.  She verbalized understanding.  

## 2012-11-24 ENCOUNTER — Ambulatory Visit (HOSPITAL_BASED_OUTPATIENT_CLINIC_OR_DEPARTMENT_OTHER): Payer: 59 | Admitting: Hematology and Oncology

## 2012-11-24 ENCOUNTER — Telehealth: Payer: Self-pay | Admitting: Hematology and Oncology

## 2012-11-24 VITALS — BP 133/85 | HR 87 | Temp 98.4°F | Resp 18 | Ht 65.0 in | Wt 124.5 lb

## 2012-11-24 DIAGNOSIS — D72819 Decreased white blood cell count, unspecified: Secondary | ICD-10-CM

## 2012-11-24 NOTE — Progress Notes (Signed)
[facility] OFFICE PROGRESS NOTE  FULP, CAMMIE, MD DIAGNOSIS:   Chronic leukopenia   SUMMARY OF HEMATOLOGIC HISTORY: This is a pleasant 32 year old lady who is being referred here because of chronic leukopenia. INTERVAL HISTORY: Penny Morrison 32 y.o. female returns for further followup. She denies any recent infection. She denies any recent fever, chills, night sweats or abnormal weight loss  I have reviewed the past medical history, past surgical history, social history and family history with the patient and they are unchanged from previous note.  ALLERGIES:  has No Known Allergies.  MEDICATIONS:  Current Outpatient Prescriptions  Medication Sig Dispense Refill  . acebutolol (SECTRAL) 200 MG capsule Take 200 mg by mouth at bedtime.      Marland Kitchen acetaminophen-codeine (TYLENOL #3) 300-30 MG per tablet Take 1 tablet by mouth every 4 (four) hours as needed. For pain      . ALPRAZolam (XANAX) 0.25 MG tablet Take 0.25 mg by mouth daily as needed. For anxiety      . cholecalciferol (VITAMIN D) 1000 UNITS tablet Take 1,000 Units by mouth at bedtime.      . Linaclotide (LINZESS PO) Take by mouth daily as needed.      . meclizine (ANTIVERT) 25 MG tablet Take 25 mg by mouth 3 (three) times daily as needed.      . medroxyPROGESTERone (DEPO-PROVERA) 150 MG/ML injection Inject 150 mg into the muscle every 3 (three) months.      . Omega-3 Fatty Acids (FISH OIL) 1200 MG CAPS Take 1 capsule by mouth at bedtime.       No current facility-administered medications for this visit.     REVIEW OF SYSTEMS:   All other systems were reviewed with the patient and are negative.  PHYSICAL EXAMINATION: ECOG PERFORMANCE STATUS: 0 - Asymptomatic  Filed Vitals:   11/24/12 0945  BP: 133/85  Pulse: 87  Temp: 98.4 F (36.9 C)  Resp: 18   Filed Weights   11/24/12 0945  Weight: 124 lb 8 oz (56.473 kg)    GENERAL:alert, no distress and comfortable NEURO: alert & oriented x 3 with fluent  speech, no focal motor/sensory deficits  LABORATORY DATA:  I have reviewed the data as listed No results found for this or any previous visit (from the past 48 hour(s)).  Lab Results  Component Value Date   WBC 3.8* 11/10/2012   HGB 13.2 11/10/2012   HCT 40.0 11/10/2012   MCV 97.7 11/10/2012   PLT 194 11/10/2012   ASSESSMENT & PLAN:  #1 chronic leukopenia I've ordered multiple tests to rule out autoimmune disease, viral infections, and leukemia. I reassured the patient all the test results came back normal. It is likely she may be exposed to some sort of unknown viral infection that caused profound leukopenia. The patient is not neutropenic. I recommend observation only and bring her back in 6 months with repeat blood work. I reassured the patient at this point in time no further testing is needed. There is no role of doing a bone marrow aspirate and biopsy at this point as the patient is asymptomatic.  All questions were answered. The patient knows to call the clinic with any problems, questions or concerns. No barriers to learning was detected.  I spent 15 minutes counseling the patient face to face. The total time spent in the appointment was 20 minutes and more than 50% was on counseling.     Thoren Hosang, MD 11/24/2012 9:59 AM
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
## 2012-12-11 ENCOUNTER — Encounter: Payer: Self-pay | Admitting: Hematology and Oncology

## 2013-05-28 ENCOUNTER — Other Ambulatory Visit: Payer: 59

## 2013-05-28 ENCOUNTER — Ambulatory Visit: Payer: 59 | Admitting: Hematology and Oncology

## 2013-05-28 ENCOUNTER — Telehealth: Payer: Self-pay | Admitting: Hematology and Oncology

## 2013-05-28 NOTE — Telephone Encounter (Signed)
returned pts call and s/w pt re new appt for 6/2.

## 2013-06-02 ENCOUNTER — Other Ambulatory Visit: Payer: 59

## 2013-06-02 ENCOUNTER — Ambulatory Visit: Payer: 59 | Admitting: Hematology and Oncology

## 2013-10-16 ENCOUNTER — Other Ambulatory Visit: Payer: Self-pay

## 2013-11-02 ENCOUNTER — Encounter: Payer: Self-pay | Admitting: Hematology and Oncology

## 2015-02-18 ENCOUNTER — Other Ambulatory Visit (HOSPITAL_COMMUNITY)
Admission: RE | Admit: 2015-02-18 | Discharge: 2015-02-18 | Disposition: A | Discharge status: Home / Self Care | Payer: Managed Care, Other (non HMO) | Source: Ambulatory Visit | Attending: Family Medicine | Admitting: Family Medicine

## 2015-02-18 ENCOUNTER — Other Ambulatory Visit: Payer: Self-pay | Admitting: Family Medicine

## 2015-02-18 DIAGNOSIS — Z01419 Encounter for gynecological examination (general) (routine) without abnormal findings: Secondary | ICD-10-CM | POA: Diagnosis present

## 2015-02-22 LAB — CYTOLOGY - PAP

## 2016-07-17 ENCOUNTER — Emergency Department (HOSPITAL_COMMUNITY): Payer: Managed Care, Other (non HMO)

## 2016-07-17 ENCOUNTER — Emergency Department (HOSPITAL_COMMUNITY)
Admission: EM | Admit: 2016-07-17 | Discharge: 2016-07-17 | Disposition: A | Discharge status: Home / Self Care | Payer: Managed Care, Other (non HMO) | Attending: Emergency Medicine | Admitting: Emergency Medicine

## 2016-07-17 ENCOUNTER — Encounter (HOSPITAL_COMMUNITY): Payer: Self-pay | Admitting: Emergency Medicine

## 2016-07-17 DIAGNOSIS — M25512 Pain in left shoulder: Secondary | ICD-10-CM

## 2016-07-17 DIAGNOSIS — Z79899 Other long term (current) drug therapy: Secondary | ICD-10-CM | POA: Insufficient documentation

## 2016-07-17 DIAGNOSIS — G8929 Other chronic pain: Secondary | ICD-10-CM | POA: Insufficient documentation

## 2016-07-17 MED ORDER — IBUPROFEN 600 MG PO TABS: 600.0000 mg | ORAL_TABLET | Freq: Four times a day (QID) | ORAL | 0 refills | Status: DC | PRN

## 2016-07-17 MED ORDER — METHOCARBAMOL 500 MG PO TABS: 500.0000 mg | ORAL_TABLET | Freq: Two times a day (BID) | ORAL | 0 refills | Status: DC
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
## 2016-07-17 NOTE — Discharge Instructions (Signed)
You have been seen today for a shoulder injury. There were no acute abnormalities on the x-rays, including no sign of fracture or dislocation. Pain: Take 600 mg of ibuprofen every 6 hours or 440 mg (over the counter dose) to 500 mg (prescription dose) of naproxen every 12 hours or for the next 3 days. After this time, these medications may be used as needed for pain. Take these medications with food to avoid upset stomach. Choose only one of these medications, do not take them together.  Tylenol: Should you continue to have additional pain while taking the ibuprofen or naproxen, you may add in tylenol as needed. Your daily total maximum amount of tylenol from all sources should be limited to 4000mg /day for persons without liver problems, or 2000mg /day for those with liver problems. Robaxin: Take the Robaxin as needed for muscle spasms. Do not drive or perform other dangerous activities while taking the Robaxin. Ice: May apply ice to the area over the next 24 hours for 15 minutes at a time to reduce swelling. Elevation: Keep the extremity elevated as often as possible to reduce pain and inflammation. Support: Wear the sling for support and comfort. Wear this until pain resolves.  Exercises: Start by performing these exercises a few times a week, increasing the frequency until you are performing them twice daily.  Follow up: Follow up with the orthopedic specialist as soon as possible on this matter. Call the number provided to set up an appointment.
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
## 2017-01-01 ENCOUNTER — Encounter (HOSPITAL_COMMUNITY): Payer: Self-pay

## 2017-01-01 ENCOUNTER — Inpatient Hospital Stay (HOSPITAL_COMMUNITY)
Admission: AD | Admit: 2017-01-01 | Discharge: 2017-01-01 | Disposition: A | Discharge status: Home / Self Care | Payer: Managed Care, Other (non HMO) | Source: Ambulatory Visit | Attending: Obstetrics & Gynecology | Admitting: Obstetrics & Gynecology

## 2017-01-01 DIAGNOSIS — Z3202 Encounter for pregnancy test, result negative: Secondary | ICD-10-CM | POA: Diagnosis not present

## 2017-01-01 DIAGNOSIS — B9689 Other specified bacterial agents as the cause of diseases classified elsewhere: Secondary | ICD-10-CM | POA: Insufficient documentation

## 2017-01-01 DIAGNOSIS — Z8632 Personal history of gestational diabetes: Secondary | ICD-10-CM | POA: Diagnosis not present

## 2017-01-01 DIAGNOSIS — R109 Unspecified abdominal pain: Secondary | ICD-10-CM

## 2017-01-01 DIAGNOSIS — N926 Irregular menstruation, unspecified: Secondary | ICD-10-CM | POA: Insufficient documentation

## 2017-01-01 DIAGNOSIS — N760 Acute vaginitis: Secondary | ICD-10-CM | POA: Diagnosis not present

## 2017-01-01 DIAGNOSIS — Z79899 Other long term (current) drug therapy: Secondary | ICD-10-CM | POA: Diagnosis not present

## 2017-01-01 DIAGNOSIS — F419 Anxiety disorder, unspecified: Secondary | ICD-10-CM | POA: Diagnosis not present

## 2017-01-01 DIAGNOSIS — R1032 Left lower quadrant pain: Secondary | ICD-10-CM | POA: Diagnosis present

## 2017-01-01 DIAGNOSIS — N83202 Unspecified ovarian cyst, left side: Secondary | ICD-10-CM | POA: Diagnosis not present

## 2017-01-01 DIAGNOSIS — N939 Abnormal uterine and vaginal bleeding, unspecified: Secondary | ICD-10-CM

## 2017-01-01 LAB — WET PREP, GENITAL
Sperm: NONE SEEN
TRICH WET PREP: NONE SEEN
Yeast Wet Prep HPF POC: NONE SEEN

## 2017-01-01 LAB — URINALYSIS, ROUTINE W REFLEX MICROSCOPIC
BACTERIA UA: NONE SEEN
Bilirubin Urine: NEGATIVE
GLUCOSE, UA: NEGATIVE mg/dL
Hgb urine dipstick: NEGATIVE
KETONES UR: NEGATIVE mg/dL
Nitrite: NEGATIVE
PH: 6 (ref 5.0–8.0)
Protein, ur: NEGATIVE mg/dL
SPECIFIC GRAVITY, URINE: 1.01 (ref 1.005–1.030)

## 2017-01-01 LAB — POCT PREGNANCY, URINE: Preg Test, Ur: NEGATIVE

## 2017-01-01 MED ORDER — METRONIDAZOLE 0.75 % VA GEL: 1.0000 | Freq: Two times a day (BID) | VAGINAL | 0 refills | Status: DC

## 2017-01-01 NOTE — MAU Note (Signed)
Tenderness on left lower abdomen for a week  Hx of cyst on l ovary  No vaginal bleeding

## 2017-01-01 NOTE — MAU Provider Note (Signed)
History     CSN: [id number]  Arrival date and time: 01/01/17 4098   First Provider Initiated Contact with Patient 01/01/17 [phone]      Chief Complaint  Patient presents with  . Abdominal Pain   HPI Penny Morrison is a 37 y.o. female who presents with abdominal pain x 1 week. Reports LLQ pain that is intermittent & throbbing in nature. When pain occurs, rates 5/10. Currently no pain. Reports hx of ovarian cyst & concerned that is what's causing the pain. Denies fever/chills, n/v/d, constipation, dysuria, vaginal bleeding, dyspareunia, or postcoital bleeding. Patient in monogamous relationship with spouse x 8 years & reports no concern for STI. Last BM was yesterday & normal for her. LMP 12/8; no contraception. Endorses some clear vaginal discharge with slight odor; no vaginal irritation.   Pt reports irregular menses for the last 2 years since a 1 time injection of depo provera. States at times skips periods & sometimes has 2 periods per month. Concerned about fertility. States they have been trying to get pregnant for a month.   Past Medical History:  Diagnosis Date  . Gestational diabetes   . Leukocytopenia, unspecified 11/10/2012  . Leukopenia   . Tachycardia   . Vertigo     Past Surgical History:  Procedure Laterality Date  . NO PAST SURGERIES      Family History  Problem Relation Age of Onset  . Cancer Maternal Grandmother        colon ca    Social History   Tobacco Use  . Smoking status: Never Smoker  . Smokeless tobacco: Never Used  Substance Use Topics  . Alcohol use: No  . Drug use: No    Allergies: No Known Allergies  Medications Prior to Admission  Medication Sig Dispense Refill Last Dose  . acebutolol (SECTRAL) 200 MG capsule Take 200 mg by mouth at bedtime.   Taking  . acetaminophen-codeine (TYLENOL #3) 300-30 MG per tablet Take 1 tablet by mouth every 4 (four) hours as needed. For pain   Taking  . ALPRAZolam (XANAX) 0.25 MG tablet Take 0.25 mg by  mouth daily as needed. For anxiety   Taking  . cholecalciferol (VITAMIN D) 1000 UNITS tablet Take 1,000 Units by mouth at bedtime.   Taking  . ibuprofen (ADVIL,MOTRIN) 600 MG tablet Take 1 tablet (600 mg total) by mouth every 6 (six) hours as needed. 30 tablet 0   . Linaclotide (LINZESS PO) Take by mouth daily as needed.   Taking  . meclizine (ANTIVERT) 25 MG tablet Take 25 mg by mouth 3 (three) times daily as needed.   Taking  . medroxyPROGESTERone (DEPO-PROVERA) 150 MG/ML injection Inject 150 mg into the muscle every 3 (three) months.   Taking  . methocarbamol (ROBAXIN) 500 MG tablet Take 1 tablet (500 mg total) by mouth 2 (two) times daily. 20 tablet 0   . Omega-3 Fatty Acids (FISH OIL) 1200 MG CAPS Take 1 capsule by mouth at bedtime.   Taking    Review of Systems  Constitutional: Negative.   Gastrointestinal: Positive for abdominal pain (none currently). Negative for constipation, diarrhea, nausea and vomiting.  Genitourinary: Positive for vaginal discharge. Negative for dyspareunia, dysuria and vaginal bleeding.   Physical Exam   Blood pressure 118/84, pulse (!) 108, temperature 98.7 F (37.1 C), temperature source Oral, resp. rate 18, last menstrual period 12/08/2016.  Physical Exam  Nursing note and vitals reviewed. Constitutional: She is oriented to person, place, and time. She appears well-developed and  well-nourished. No distress.  HENT:  Head: Normocephalic and atraumatic.  Eyes: Conjunctivae are normal. Right eye exhibits no discharge. Left eye exhibits no discharge. No scleral icterus.  Neck: Normal range of motion.  Respiratory: Effort normal. No respiratory distress.  GI: Soft. Bowel sounds are normal. She exhibits no distension. There is no tenderness.  Genitourinary: Uterus normal. Cervix exhibits no motion tenderness. Right adnexum displays no mass and no tenderness. Left adnexum displays no mass and no tenderness. No bleeding in the vagina. Vaginal discharge found.   Neurological: She is alert and oriented to person, place, and time.  Skin: Skin is warm and dry. She is not diaphoretic.  Psychiatric: She has a normal mood and affect. Her behavior is normal. Judgment and thought content normal.    MAU Course  Procedures Results for orders placed or performed during the hospital encounter of 01/01/17 (from the past 24 hour(s))  Urinalysis, Routine w reflex microscopic     Status: Abnormal   Collection Time: 01/01/17  8:30 AM  Result Value Ref Range   Color, Urine YELLOW YELLOW   APPearance HAZY (A) CLEAR   Specific Gravity, Urine 1.010 1.005 - 1.030   pH 6.0 5.0 - 8.0   Glucose, UA NEGATIVE NEGATIVE mg/dL   Hgb urine dipstick NEGATIVE NEGATIVE   Bilirubin Urine NEGATIVE NEGATIVE   Ketones, ur NEGATIVE NEGATIVE mg/dL   Protein, ur NEGATIVE NEGATIVE mg/dL   Nitrite NEGATIVE NEGATIVE   Leukocytes, UA TRACE (A) NEGATIVE   RBC / HPF 0-5 0 - 5 RBC/hpf   WBC, UA 0-5 0 - 5 WBC/hpf   Bacteria, UA NONE SEEN NONE SEEN   Squamous Epithelial / LPF 6-30 (A) NONE SEEN  Wet prep, genital     Status: Abnormal   Collection Time: 01/01/17  9:06 AM  Result Value Ref Range   Yeast Wet Prep HPF POC NONE SEEN NONE SEEN   Trich, Wet Prep NONE SEEN NONE SEEN   Clue Cells Wet Prep HPF POC PRESENT (A) NONE SEEN   WBC, Wet Prep HPF POC FEW (A) NONE SEEN   Sperm NONE SEEN   Pregnancy, urine POC     Status: None   Collection Time: 01/01/17  9:09 AM  Result Value Ref Range   Preg Test, Ur NEGATIVE NEGATIVE    MDM UPT negative VSS, NAD Currently no pain. No adnexal mass appreciated on bimanual Wet prep collected -- pt declined sti testing  Assessment and Plan  A: 1. BV (bacterial vaginosis)   2. Left lower quadrant pain   3. Pregnancy examination or test, negative result   4. Abnormal uterine bleeding (AUB)    P: Discharge home Rx metrogel Outpatient pelvic ultrasound ordered Msg to [facility] for appt to discuss ultrasound results & DUB Discussed reasons to  return to MAU vs ED   Judeth HornErin Kamia Insalaco 01/01/2017, 8:51 AM

## 2017-01-01 NOTE — Discharge Instructions (Signed)
Dysfunctional Uterine Bleeding °Dysfunctional uterine bleeding is abnormal bleeding from the uterus. Dysfunctional uterine bleeding includes: °· A period that comes earlier or later than usual. °· A period that is lighter, heavier, or has blood clots. °· Bleeding between periods. °· Skipping one or more periods. °· Bleeding after sexual intercourse. °· Bleeding after menopause. ° °Follow these instructions at home: °Pay attention to any changes in your symptoms. Follow these instructions to help with your condition: °Eating and drinking °· Eat well-balanced meals. Include foods that are high in iron, such as liver, meat, shellfish, green leafy vegetables, and eggs. °· If you become constipated: °? Drink plenty of water. °? Eat fruits and vegetables that are high in water and fiber, such as spinach, carrots, raspberries, apples, and mango. °Medicines °· Take over-the-counter and prescription medicines only as told by your health care provider. °· Do not change medicines without talking with your health care provider. °· Aspirin or medicines that contain aspirin may make the bleeding worse. Do not take those medicines: °? During the week before your period. °? During your period. °· If you were prescribed iron pills, take them as told by your health care provider. Iron pills help to replace iron that your body loses because of this condition. °Activity °· If you need to change your sanitary pad or tampon more than one time every 2 hours: °? Lie in bed with your feet raised (elevated). °? Place a cold pack on your lower abdomen. °? Rest as much as possible until the bleeding stops or slows down. °· Do not try to lose weight until the bleeding has stopped and your blood iron level is back to normal. °Other Instructions °· For two months, write down: °? When your period starts. °? When your period ends. °? When any abnormal bleeding occurs. °? What problems you notice. °· Keep all follow up visits as told by your health  care provider. This is important. °Contact a health care provider if: °· You get light-headed or weak. °· You have nausea and vomiting. °· You cannot eat or drink without vomiting. °· You feel dizzy or have diarrhea while you are taking medicines. °· You are taking birth control pills or hormones, and you want to change them or stop taking them. °Get help right away if: °· You develop a fever or chills. °· You need to change your sanitary pad or tampon more than one time per hour. °· Your bleeding becomes heavier, or your flow contains clots more often. °· You develop pain in your abdomen. °· You lose consciousness. °· You develop a rash. °This information is not intended to replace advice given to you by your health care provider. Make sure you discuss any questions you have with your health care provider. °Document Released: 12/16/1999 Document Revised: 05/26/2015 Document Reviewed: 03/15/2014 °Elsevier Interactive Patient Education © 2018 [employer] ° °

## 2017-01-08 ENCOUNTER — Other Ambulatory Visit: Payer: Self-pay

## 2017-01-08 ENCOUNTER — Ambulatory Visit (HOSPITAL_COMMUNITY): Payer: Managed Care, Other (non HMO)

## 2017-01-16 ENCOUNTER — Ambulatory Visit (HOSPITAL_COMMUNITY): Payer: Managed Care, Other (non HMO)

## 2017-01-18 ENCOUNTER — Ambulatory Visit (HOSPITAL_COMMUNITY): Admission: RE | Admit: 2017-01-18 | Payer: Managed Care, Other (non HMO) | Source: Ambulatory Visit

## 2017-01-28 ENCOUNTER — Encounter: Payer: Managed Care, Other (non HMO) | Admitting: Obstetrics & Gynecology

## 2017-09-17 ENCOUNTER — Other Ambulatory Visit: Payer: Self-pay | Admitting: Obstetrics and Gynecology

## 2017-09-17 ENCOUNTER — Other Ambulatory Visit (HOSPITAL_COMMUNITY)
Admission: RE | Admit: 2017-09-17 | Discharge: 2017-09-17 | Disposition: A | Discharge status: Home / Self Care | Payer: Managed Care, Other (non HMO) | Source: Ambulatory Visit | Attending: Obstetrics and Gynecology | Admitting: Obstetrics and Gynecology

## 2017-09-17 DIAGNOSIS — Z01411 Encounter for gynecological examination (general) (routine) with abnormal findings: Secondary | ICD-10-CM | POA: Insufficient documentation

## 2017-09-19 LAB — CYTOLOGY - PAP
Diagnosis: NEGATIVE
HPV: NOT DETECTED

## 2017-10-31 ENCOUNTER — Encounter (HOSPITAL_COMMUNITY): Payer: Self-pay | Admitting: *Deleted

## 2017-10-31 ENCOUNTER — Inpatient Hospital Stay (HOSPITAL_COMMUNITY): Payer: Managed Care, Other (non HMO)

## 2017-10-31 ENCOUNTER — Inpatient Hospital Stay (HOSPITAL_COMMUNITY)
Admission: AD | Admit: 2017-10-31 | Discharge: 2017-10-31 | Disposition: A | Discharge status: Home / Self Care | Payer: Managed Care, Other (non HMO) | Source: Ambulatory Visit | Attending: Obstetrics and Gynecology | Admitting: Obstetrics and Gynecology

## 2017-10-31 ENCOUNTER — Other Ambulatory Visit: Payer: Self-pay

## 2017-10-31 DIAGNOSIS — R10A0 Flank pain, unspecified side: Secondary | ICD-10-CM

## 2017-10-31 DIAGNOSIS — O161 Unspecified maternal hypertension, first trimester: Secondary | ICD-10-CM | POA: Insufficient documentation

## 2017-10-31 DIAGNOSIS — O209 Hemorrhage in early pregnancy, unspecified: Secondary | ICD-10-CM | POA: Diagnosis not present

## 2017-10-31 DIAGNOSIS — O26891 Other specified pregnancy related conditions, first trimester: Secondary | ICD-10-CM

## 2017-10-31 DIAGNOSIS — Z3A01 Less than 8 weeks gestation of pregnancy: Secondary | ICD-10-CM | POA: Insufficient documentation

## 2017-10-31 DIAGNOSIS — Z79899 Other long term (current) drug therapy: Secondary | ICD-10-CM | POA: Insufficient documentation

## 2017-10-31 DIAGNOSIS — R109 Unspecified abdominal pain: Secondary | ICD-10-CM

## 2017-10-31 DIAGNOSIS — R102 Pelvic and perineal pain: Secondary | ICD-10-CM

## 2017-10-31 DIAGNOSIS — O3680X0 Pregnancy with inconclusive fetal viability, not applicable or unspecified: Secondary | ICD-10-CM

## 2017-10-31 HISTORY — DX: Essential (primary) hypertension: I10

## 2017-10-31 LAB — URINALYSIS, ROUTINE W REFLEX MICROSCOPIC
Bilirubin Urine: NEGATIVE
Glucose, UA: NEGATIVE mg/dL
Hgb urine dipstick: NEGATIVE
Ketones, ur: 5 mg/dL — AB
Leukocytes, UA: NEGATIVE
Nitrite: NEGATIVE
Protein, ur: NEGATIVE mg/dL
Specific Gravity, Urine: 1.03 (ref 1.005–1.030)
pH: 7 (ref 5.0–8.0)

## 2017-10-31 LAB — POCT PREGNANCY, URINE: Preg Test, Ur: POSITIVE — AB

## 2017-10-31 NOTE — MAU Note (Signed)
+  HPT on Tuesday.  Has been cramping since last wk, getting more severe..  Just wants to make sure everything is ok.

## 2017-10-31 NOTE — MAU Provider Note (Signed)
History     CSN: [id number]  Arrival date and time: 10/31/17 1540   First Provider Initiated Contact with Patient 10/31/17 1626      Chief Complaint  Patient presents with  . Vaginal Bleeding  . Possible Pregnancy   Penny Morrison presents for positive pregnancy test and abdominal cramping Pain started about a week ago and has become more intense Rates it 7/10 Hasn't tried anything for it at home LMP 09/06/17 ([redacted]w[redacted]d today) Pain located in LLQ Reports every other day bowel movements Pain is constant  No obvious exacerbating factors Has not pursued prenatal care just yet Denies vaginal bleeding    Past Medical History:  Diagnosis Date  . Gestational diabetes   . Hypertension   . Leukocytopenia, unspecified 11/10/2012  . Leukopenia   . Tachycardia   . Vertigo     Past Surgical History:  Procedure Laterality Date  . NO PAST SURGERIES      Family History  Problem Relation Age of Onset  . Cancer Maternal Grandmother        colon ca    Social History   Tobacco Use  . Smoking status: Never Smoker  . Smokeless tobacco: Never Used  Substance Use Topics  . Alcohol use: No  . Drug use: No    Allergies: No Known Allergies  Medications Prior to Admission  Medication Sig Dispense Refill Last Dose  . acebutolol (SECTRAL) 200 MG capsule Take 200 mg by mouth at bedtime.   10/30/2017 at Unknown time  . cholecalciferol (VITAMIN D) 1000 UNITS tablet Take 1,000 Units by mouth at bedtime.   Past Week at Unknown time  . Prenatal MV-Min-Fe Fum-FA-DHA (PRENATAL+DHA PO) Take 1 each by mouth daily.   10/31/2017 at Unknown time  . Vitamin D, Ergocalciferol, (DRISDOL) 50000 units CAPS capsule Take 50,000 Units by mouth every 7 (seven) days. On Sundays   Past Week at Unknown time  . acetaminophen-codeine (TYLENOL #3) 300-30 MG per tablet Take 1 tablet by mouth every 4 (four) hours as needed. For pain   Taking  . ibuprofen (ADVIL,MOTRIN) 600 MG tablet Take 1 tablet (600 mg total) by  mouth every 6 (six) hours as needed. (Patient not taking: Reported on 10/31/2017) 30 tablet 0 Not Taking at Unknown time  . Linaclotide (LINZESS PO) Take by mouth daily as needed.   Taking  . methocarbamol (ROBAXIN) 500 MG tablet Take 1 tablet (500 mg total) by mouth 2 (two) times daily. (Patient not taking: Reported on 10/31/2017) 20 tablet 0 Not Taking at Unknown time  . metroNIDAZOLE (METROGEL VAGINAL) 0.75 % vaginal gel Place 1 Applicatorful vaginally 2 (two) times daily. (Patient not taking: Reported on 10/31/2017) 70 g 0 Not Taking at Unknown time    Review of Systems  All other systems reviewed and are negative.  Physical Exam   Blood pressure (!) 143/87, pulse (!) 105, temperature 98.2 F (36.8 C), temperature source Oral, resp. rate 17, height 5\' 5"  (1.651 m), weight 63.7 kg, last menstrual period 09/06/2017, SpO2 100 %.  Physical Exam  Constitutional: She is oriented to person, place, and time. She appears well-developed and well-nourished. No distress.  HENT:  Head: Normocephalic and atraumatic.  Eyes: Pupils are equal, round, and reactive to light. Right eye exhibits no discharge. Left eye exhibits no discharge.  Cardiovascular: Normal rate and regular rhythm.  Respiratory: Effort normal.  GI: Soft. There is no tenderness.  Neurological: She is alert and oriented to person, place, and time.  Skin: Skin is  warm and dry. She is not diaphoretic.  Psychiatric: She has a normal mood and affect. Her behavior is normal. Judgment and thought content normal.    MAU Course  Procedures  MDM Pregnancy of unknown location on presentation  Obtained US which shows IUP at [redacted]w[redacted]d Planning to follow up with Dr. Richardson Dopp  Assessment and Plan  Pelvic Pain - [place] confirms IUP w appropriate fetal heart tones - No vaginal bleeding  - Encouraged pt to start PNV (already taking) and avoid teratogens - Follow up with Dr. Richardson Dopp to initiate prenatal care - Return precautions discussed  Penny Morrison 10/31/2017, 4:34 PM

## 2017-11-07 LAB — OB RESULTS CONSOLE HEPATITIS B SURFACE ANTIGEN: Hepatitis B Surface Ag: NEGATIVE

## 2017-11-07 LAB — OB RESULTS CONSOLE ABO/RH: RH Type: POSITIVE

## 2017-11-07 LAB — OB RESULTS CONSOLE RPR: RPR: NONREACTIVE

## 2017-11-07 LAB — OB RESULTS CONSOLE GC/CHLAMYDIA
Chlamydia: NEGATIVE
Gonorrhea: NEGATIVE

## 2017-11-07 LAB — OB RESULTS CONSOLE HIV ANTIBODY (ROUTINE TESTING): HIV: NONREACTIVE

## 2017-11-07 LAB — OB RESULTS CONSOLE RUBELLA ANTIBODY, IGM: Rubella: IMMUNE

## 2017-11-07 LAB — OB RESULTS CONSOLE ANTIBODY SCREEN: Antibody Screen: NEGATIVE

## 2017-11-29 ENCOUNTER — Encounter (HOSPITAL_COMMUNITY): Payer: Self-pay | Admitting: *Deleted

## 2017-11-29 ENCOUNTER — Inpatient Hospital Stay (HOSPITAL_COMMUNITY)
Admission: AD | Admit: 2017-11-29 | Discharge: 2017-11-29 | Disposition: A | Discharge status: Home / Self Care | Payer: Managed Care, Other (non HMO) | Source: Ambulatory Visit | Attending: Obstetrics & Gynecology | Admitting: Obstetrics & Gynecology

## 2017-11-29 ENCOUNTER — Other Ambulatory Visit: Payer: Self-pay

## 2017-11-29 DIAGNOSIS — G2581 Restless legs syndrome: Secondary | ICD-10-CM | POA: Diagnosis present

## 2017-11-29 DIAGNOSIS — Z79899 Other long term (current) drug therapy: Secondary | ICD-10-CM | POA: Insufficient documentation

## 2017-11-29 DIAGNOSIS — O10011 Pre-existing essential hypertension complicating pregnancy, first trimester: Secondary | ICD-10-CM | POA: Insufficient documentation

## 2017-11-29 DIAGNOSIS — O99351 Diseases of the nervous system complicating pregnancy, first trimester: Secondary | ICD-10-CM | POA: Insufficient documentation

## 2017-11-29 DIAGNOSIS — F1121 Opioid dependence, in remission: Secondary | ICD-10-CM

## 2017-11-29 DIAGNOSIS — Z3A12 12 weeks gestation of pregnancy: Secondary | ICD-10-CM

## 2017-11-29 DIAGNOSIS — Z8249 Family history of ischemic heart disease and other diseases of the circulatory system: Secondary | ICD-10-CM | POA: Insufficient documentation

## 2017-11-29 DIAGNOSIS — O26891 Other specified pregnancy related conditions, first trimester: Secondary | ICD-10-CM | POA: Diagnosis not present

## 2017-11-29 MED ORDER — ZOLPIDEM TARTRATE 5 MG PO TABS: 5.0000 mg | ORAL_TABLET | Freq: Every evening | ORAL | 0 refills | Status: DC | PRN

## 2017-11-29 NOTE — Discharge Instructions (Signed)

## 2017-11-29 NOTE — MAU Note (Signed)
Hasn't slept in 4 days. Has really bad restless leg syndrome, new problem with preg.

## 2017-11-29 NOTE — MAU Provider Note (Signed)
First Provider Initiated Contact with Patient 11/29/17 [phone]      Chief Complaint:  restless leg and sleeplessness   Penny Morrison is  37 y.o. G2P1001 at [redacted]w[redacted]d presents complaining of restless leg and sleeplessness She states that she started having RLS during this pregnancy; nothing helps, and she states that she "hasn't slept in 4 days".  States has tried Tylenol PM and unisom w/o results.   Obstetrical/Gynecological History: OB History    Gravida  2   Para  1   Term  1   Preterm      AB      Living  1     SAB      TAB      Ectopic      Multiple      Live Births  1          Past Medical History: Past Medical History:  Diagnosis Date  . Gestational diabetes   . Hypertension   . Leukocytopenia, unspecified 11/10/2012  . Leukopenia   . Tachycardia   . Vertigo     Past Surgical History: Past Surgical History:  Procedure Laterality Date  . NO PAST SURGERIES      Family History: Family History  Problem Relation Age of Onset  . Cancer Maternal Grandmother        colon ca  . Hypertension Maternal Grandmother   . Healthy Mother   . Healthy Father     Social History: Social History   Tobacco Use  . Smoking status: Never Smoker  . Smokeless tobacco: Never Used  Substance Use Topics  . Alcohol use: No  . Drug use: No    Allergies: No Known Allergies  Meds:  Medications Prior to Admission  Medication Sig Dispense Refill Last Dose  . acebutolol (SECTRAL) 200 MG capsule Take 200 mg by mouth at bedtime.   11/28/2017 at Unknown time  . diphenhydramine-acetaminophen (TYLENOL PM) 25-500 MG TABS tablet Take 2 tablets by mouth at bedtime as needed.   11/28/2017 at Unknown time  . doxylamine, Sleep, (UNISOM) 25 MG tablet Take 25 mg by mouth at bedtime as needed.   11/28/2017 at Unknown time  . ibuprofen (ADVIL,MOTRIN) 600 MG tablet Take 1 tablet (600 mg total) by mouth every 6 (six) hours as needed. 30 tablet 0 Past Month at Unknown time  .  NIFEdipine (PROCARDIA-XL/NIFEDICAL-XL) 30 MG 24 hr tablet Take 30 mg by mouth daily.   11/29/2017 at Unknown time  . ondansetron (ZOFRAN) 4 MG tablet Take 4 mg by mouth every 8 (eight) hours as needed for nausea or vomiting.     . Prenatal MV-Min-Fe Fum-FA-DHA (PRENATAL+DHA PO) Take 1 each by mouth daily.   11/28/2017 at Unknown time  . acetaminophen-codeine (TYLENOL #3) 300-30 MG per tablet Take 1 tablet by mouth every 4 (four) hours as needed. For pain   More than a month at Unknown time  . cholecalciferol (VITAMIN D) 1000 UNITS tablet Take 1,000 Units by mouth at bedtime.   Unknown at Unknown time  . Linaclotide (LINZESS PO) Take by mouth daily as needed.   Taking  . Vitamin D, Ergocalciferol, (DRISDOL) 50000 units CAPS capsule Take 50,000 Units by mouth every 7 (seven) days. On Sundays   Past Week at Unknown time    Review of Systems   Constitutional: Negative for fever and chills Eyes: Negative for visual disturbances Respiratory: Negative for shortness of breath, dyspnea Cardiovascular: Negative for chest pain or palpitations  Gastrointestinal: Negative for  vomiting, diarrhea and constipation Genitourinary: Negative for dysuria and urgency Musculoskeletal: Negative for back pain, joint pain  Normal ROM  Neurological: Negative for dizziness and headaches    Physical Exam  Blood pressure 126/85, pulse 91, temperature 98 F (36.7 C), temperature source Oral, resp. rate 16, weight 61.8 kg, last menstrual period 09/06/2017, SpO2 100 %. GENERAL: Well-developed, well-nourished female in no acute distress. Appears weary  LUNGS: Normal respiratory effort HEART: Regular rate and rhythm. ABDOMEN: Soft, nontender, nondistended. +  FHT via doppler EXTREMITIES: Nontender, no edema, 2+ distal pulses. Neg Homan's DTR's 2+   Labs: No results found for this or any previous visit (from the past 24 hour(s)). Imaging Studies:    Assessment: Penny Morrison is  37 y.o. G2P1001 at 1835w0d  presents with restless leg syndrome, unable to sleep "for 4 days.  Plan: Pt to discuss possibility of changing her Procardia d/t RLS possibly being exacerbated by it (started it when became pregnant).  In addition, DEA registry shows active rx for Buprenorphine,2mg  (last filled 10/30). Pt states that she "hasn't taken that in a minute."  RLS could be exacerbated by withdrawal.  Pt encouraged that if that is the case, w/d will ease soon.  Also aware that pregnancy, in and of itself, contributes significantly to the incidence of RLS.     Discussed that controlled substances should not be prescribed for long term use in opioid addicts, and that a short term rx for Ambien 5mg  #6 w/o RF given to help her get some much needed sleep.  Pt readily agrees.    Jacklyn ShellFrances Cresenzo-Dishmon, DNP, CNM 11/29/20199:04 AM

## 2018-01-01 NOTE — L&D Delivery Note (Signed)
Operative Delivery Note At 6:14 PM a viable female was delivered via Vaginal, [employer], operational).  Presentation: vertex; Position: Occiput,, Anterior; Station: +2. VAVD done for prolonged decelerations in late first and second stages of labor.    Verbal consent: obtained from patient.  Risks and benefits discussed in detail.  Risks include, but are not limited to the risks of anesthesia, bleeding, infection, damage to maternal tissues, fetal cephalhematoma.  There is also the risk of inability to effect vaginal delivery of the head, or shoulder dystocia that cannot be resolved by established maneuvers, leading to the need for emergency cesarean section.  APGAR: 9, 9; weight pending.   Placenta status: Appeared normal, grossly intact and with three vessel cord.    Anesthesia:  Epidural Instruments: [employer] Episiotomy: None Lacerations: 2nd degree;Perineal Suture Repair: 3.0 vicryl Est. Blood Loss (mL): 305  Mom to postpartum.  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   Konrad Felix, MD.  05/23/2018, 7:26 PM

## 2018-01-08 ENCOUNTER — Other Ambulatory Visit (HOSPITAL_COMMUNITY): Payer: Self-pay | Admitting: Obstetrics and Gynecology

## 2018-01-08 DIAGNOSIS — O10912 Unspecified pre-existing hypertension complicating pregnancy, second trimester: Secondary | ICD-10-CM

## 2018-01-13 ENCOUNTER — Encounter (HOSPITAL_COMMUNITY): Payer: Self-pay

## 2018-01-15 ENCOUNTER — Ambulatory Visit (HOSPITAL_BASED_OUTPATIENT_CLINIC_OR_DEPARTMENT_OTHER)
Admission: RE | Admit: 2018-01-15 | Discharge: 2018-01-15 | Disposition: A | Discharge status: Home / Self Care | Payer: Managed Care, Other (non HMO) | Source: Ambulatory Visit | Attending: Obstetrics and Gynecology | Admitting: Obstetrics and Gynecology

## 2018-01-15 ENCOUNTER — Other Ambulatory Visit (HOSPITAL_COMMUNITY): Payer: Self-pay | Admitting: Obstetrics and Gynecology

## 2018-01-15 ENCOUNTER — Other Ambulatory Visit (HOSPITAL_COMMUNITY): Payer: Self-pay | Admitting: *Deleted

## 2018-01-15 ENCOUNTER — Encounter (HOSPITAL_COMMUNITY): Payer: Self-pay

## 2018-01-15 ENCOUNTER — Ambulatory Visit (HOSPITAL_COMMUNITY): Payer: Self-pay | Admitting: Obstetrics and Gynecology

## 2018-01-15 ENCOUNTER — Ambulatory Visit (HOSPITAL_COMMUNITY)
Admission: RE | Admit: 2018-01-15 | Discharge: 2018-01-15 | Disposition: A | Discharge status: Home / Self Care | Payer: Managed Care, Other (non HMO) | Source: Ambulatory Visit | Attending: Obstetrics and Gynecology | Admitting: Obstetrics and Gynecology

## 2018-01-15 ENCOUNTER — Ambulatory Visit (HOSPITAL_COMMUNITY): Admission: RE | Admit: 2018-01-15 | Payer: Managed Care, Other (non HMO) | Source: Ambulatory Visit

## 2018-01-15 DIAGNOSIS — O285 Abnormal chromosomal and genetic finding on antenatal screening of mother: Secondary | ICD-10-CM

## 2018-01-15 DIAGNOSIS — Z363 Encounter for antenatal screening for malformations: Secondary | ICD-10-CM | POA: Diagnosis not present

## 2018-01-15 DIAGNOSIS — Z3A18 18 weeks gestation of pregnancy: Secondary | ICD-10-CM

## 2018-01-15 DIAGNOSIS — O289 Unspecified abnormal findings on antenatal screening of mother: Secondary | ICD-10-CM | POA: Diagnosis not present

## 2018-01-15 DIAGNOSIS — Z8279 Family history of other congenital malformations, deformations and chromosomal abnormalities: Secondary | ICD-10-CM | POA: Insufficient documentation

## 2018-01-15 DIAGNOSIS — O09292 Supervision of pregnancy with other poor reproductive or obstetric history, second trimester: Secondary | ICD-10-CM | POA: Diagnosis not present

## 2018-01-15 DIAGNOSIS — O10012 Pre-existing essential hypertension complicating pregnancy, second trimester: Secondary | ICD-10-CM | POA: Diagnosis not present

## 2018-01-15 DIAGNOSIS — O09522 Supervision of elderly multigravida, second trimester: Secondary | ICD-10-CM | POA: Diagnosis not present

## 2018-01-15 DIAGNOSIS — O10912 Unspecified pre-existing hypertension complicating pregnancy, second trimester: Secondary | ICD-10-CM

## 2018-01-15 NOTE — Consult Note (Signed)
Maternal-Fetal Medicine  Name: Penny Morrison MRN: [id number] Requesting Provider: Christophe Louis, MD  I had the pleasure of seeing Penny Morrison today at the [facility]. She is a G2 P100q at [place] gestation and is here for ultrasound and consultation.  On cell-free fetal DNA screening, the risk of Down syndrome was increased. She also had first-trimester screening that showed low risk for fetal aneuploidies.  Past medical history is significant for chronic hypertension. Patient reports she does not have hypertension when she is not pregnant. She takes labetalol 200 mg daily. In addition, she takes acebutalol for c/o palpitations. 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 She was discharged later because of resolution of heart block (?).  Patient does not have diabetes or any other chronic medical conditions. She reports she does not have sickle-cell trait. 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  PSH: Nil of note. Medications: Prenatal vitamins, labetalol, acebutalol, gabapentin. Allergies: NDKA. Social: Denies tobacco or drug or alcohol use. She has been married 9 years and her husband (Caucasian) is in good health. Family: No history of venous thromboembolism. Obstetric history is significant for a term vaginal delivery in 2012 of a female infant (Penny Morrison) weighing 7-8 at birth.  Gyn history: History of PCOS. This is a natural conception. She does not have breast disease.  Ultrasound: We performed a fetal anatomy scan. An echogenic intracardiac focus is seen in the left ventricle. No other markers of aneuploidies or fetal structural defects are seen. Fetal biometry is consistent with her previously-established dates. Amniotic fluid is normal and good fetal activity is seen. Patient understands the limitations of ultrasound in detecting fetal anomalies.    After informed consent, amniocentesis was performed by Dr. Donalee Citrin under ultrasound guidance and 30 milliliters of clear amniotic fluid was withdrawn. Fluid was sent to [facility] for AFAFP, fetal karyotype and reflex microarray analysis. Patient tolerated the procedure well. Post-procedure fetal heart rate was normal. We gave her post-procedure instructions.  I counseled the patient on the following: Chronic hypertension: I counseled the patient on the possible adverse effects from uncontrolled hypertension. Adverse effects in pregnancy correlate well with the severity of hypertension and the duration of hypertension. Maternal complications (in poorly-controlled hypertension) include stroke, pulmonary edema, and renal failure. Placental abruption fetal growth restriction. Superimposed preeclamspsia occurs in up to 30% of patients with chronic hypertension.   Most antihypertensives prescribed in pregnancy have established safety-profiles. Labetolol and methyldopa are the most-commonly used drugs. No increased congenital malformations have been noted with these drugs. Calcium channel blocker (nifedipine) can also be used as part of multi-drug regimen. Acebutalol (category B) is not associated with increased risk for fetal congenital malformations. Patient may benefit from cardiology consultation to address her history of first-degree heart block. Fetal surveillance: We recommend serial growth scans to monitor fetal growth, and initiating weekly antepartum testing (BPP and NST) from [redacted] weeks gestation until delivery. If hypertension is severe, early initiation and more frequent antenatal testing should be performed.  Timing of delivery: Consider delivery at 39 weeks. If her blood pressure is poorly-controlled, earlier delivery may be considered.  Low-dose aspirin reduces the likelihood of developing preeclampsia. I recommended aspirin 81 mg PO daily until delivery. I also informed her that it is safe to  take aspirin and it does not cause adverse fetal effects. Patient is not allergic to aspirin and does  not give history of gastric ulcers or bleeding.  Advanced maternal age and increased risk for Down syndrome; I explained the significance and limitations of cell-free fetal DNA screening. The true positive rate for Down syndrome (maternal age at delivery 37 years) is around 85% (false positive 15%).   I informed her that echogenic intracardiac focus is a marker for Down syndrome, but is also present in normal fetuses. I informed the patient that only amniocentesis will give a definitive result on the fetal karyoytype. I discussed the procedure and its possible complication of miscarriage (1 in 400 procedures).  Patient met with our genetic counselor after ultrasound and opted to have amniocentesis.  Gabapentin:  Patient uses gabapentin prn (for pain). I reassured her that gabapentin is not associated with increased congenital malformations.   Thank you for your consult. Please do not hesitate to contact me if you have any questions or concerns.  Consultation including face-to-face counseling: 40 min.  Recommendations: -We will communicate amniocentesis results to the patient and fax copies to your office. -An appointment was made for her to return in 4 weeks for fetal growth and revisit anatomy (normal today). -We will set up an appointment for fetal echocardiography. -Cardiology appointment.

## 2018-01-15 NOTE — ED Notes (Signed)
Patient and family in session with Genetic Counselor, Moody Bruins. Patient to have amniocentesis after genetic counseling.

## 2018-01-16 ENCOUNTER — Other Ambulatory Visit (HOSPITAL_COMMUNITY): Payer: Self-pay | Admitting: *Deleted

## 2018-01-16 DIAGNOSIS — O285 Abnormal chromosomal and genetic finding on antenatal screening of mother: Secondary | ICD-10-CM

## 2018-01-17 ENCOUNTER — Telehealth (HOSPITAL_COMMUNITY): Payer: Self-pay | Admitting: Obstetrics and Gynecology

## 2018-01-17 ENCOUNTER — Other Ambulatory Visit (HOSPITAL_COMMUNITY): Payer: Self-pay | Admitting: Obstetrics and Gynecology

## 2018-01-17 ENCOUNTER — Encounter (HOSPITAL_COMMUNITY): Payer: Self-pay | Admitting: *Deleted

## 2018-01-17 ENCOUNTER — Observation Stay (HOSPITAL_COMMUNITY)
Admission: AD | Admit: 2018-01-17 | Discharge: 2018-01-18 | Disposition: A | Discharge status: Home / Self Care | Payer: Managed Care, Other (non HMO) | Source: Ambulatory Visit | Attending: Obstetrics & Gynecology | Admitting: Obstetrics & Gynecology

## 2018-01-17 DIAGNOSIS — N898 Other specified noninflammatory disorders of vagina: Secondary | ICD-10-CM | POA: Insufficient documentation

## 2018-01-17 DIAGNOSIS — F1121 Opioid dependence, in remission: Secondary | ICD-10-CM

## 2018-01-17 DIAGNOSIS — Z3A19 19 weeks gestation of pregnancy: Secondary | ICD-10-CM | POA: Diagnosis not present

## 2018-01-17 DIAGNOSIS — O42912 Preterm premature rupture of membranes, unspecified as to length of time between rupture and onset of labor, second trimester: Principal | ICD-10-CM | POA: Insufficient documentation

## 2018-01-17 DIAGNOSIS — O42919 Preterm premature rupture of membranes, unspecified as to length of time between rupture and onset of labor, unspecified trimester: Secondary | ICD-10-CM | POA: Diagnosis present

## 2018-01-17 HISTORY — DX: Gestational (pregnancy-induced) hypertension without significant proteinuria, unspecified trimester: O13.9

## 2018-01-17 LAB — URINALYSIS, ROUTINE W REFLEX MICROSCOPIC
BILIRUBIN URINE: NEGATIVE
Glucose, UA: NEGATIVE mg/dL
HGB URINE DIPSTICK: NEGATIVE
Ketones, ur: NEGATIVE mg/dL
Leukocytes, UA: NEGATIVE
NITRITE: NEGATIVE
PH: 6 (ref 5.0–8.0)
Protein, ur: NEGATIVE mg/dL
SPECIFIC GRAVITY, URINE: 1.02 (ref 1.005–1.030)

## 2018-01-17 NOTE — Telephone Encounter (Signed)
I called [name] and informed her of abnormal FISH results (Trisomy 21). I informed her that we will communicate the final results when available.

## 2018-01-17 NOTE — MAU Note (Signed)
ABout an hour ago had gush of clear fld and continues to keep leaking some. Had amniocentesis on Weds and have been taking it easy. Some pressure in lower abd

## 2018-01-18 ENCOUNTER — Encounter (HOSPITAL_COMMUNITY): Payer: Self-pay | Admitting: Family Medicine

## 2018-01-18 ENCOUNTER — Other Ambulatory Visit: Payer: Self-pay

## 2018-01-18 DIAGNOSIS — O42919 Preterm premature rupture of membranes, unspecified as to length of time between rupture and onset of labor, unspecified trimester: Secondary | ICD-10-CM | POA: Diagnosis present

## 2018-01-18 LAB — CBC WITH DIFFERENTIAL/PLATELET
BASOS ABS: 0 10*3/uL (ref 0.0–0.1)
Basophils Relative: 0 %
Eosinophils Absolute: 0.2 10*3/uL (ref 0.0–0.5)
Eosinophils Relative: 1 %
HCT: 30.9 % — ABNORMAL LOW (ref 36.0–46.0)
Hemoglobin: 10.2 g/dL — ABNORMAL LOW (ref 12.0–15.0)
Lymphocytes Relative: 20 %
Lymphs Abs: 2.6 10*3/uL (ref 0.7–4.0)
MCH: 33.3 pg (ref 26.0–34.0)
MCHC: 33 g/dL (ref 30.0–36.0)
MCV: 101 fL — ABNORMAL HIGH (ref 80.0–100.0)
Monocytes Absolute: 0.5 10*3/uL (ref 0.1–1.0)
Monocytes Relative: 4 %
NRBC: 0 % (ref 0.0–0.2)
Neutro Abs: 9.6 10*3/uL — ABNORMAL HIGH (ref 1.7–7.7)
Neutrophils Relative %: 75 %
Platelets: 258 10*3/uL (ref 150–400)
RBC: 3.06 MIL/uL — ABNORMAL LOW (ref 3.87–5.11)
RDW: 14.2 % (ref 11.5–15.5)
WBC: 12.9 10*3/uL — ABNORMAL HIGH (ref 4.0–10.5)

## 2018-01-18 LAB — TYPE AND SCREEN
ABO/RH(D): O POS
Antibody Screen: NEGATIVE

## 2018-01-18 MED ORDER — CALCIUM CARBONATE ANTACID 500 MG PO CHEW: 2.0000 | CHEWABLE_TABLET | ORAL | Status: DC | PRN

## 2018-01-18 MED ORDER — LABETALOL HCL 200 MG PO TABS
200.0000 mg | ORAL_TABLET | Freq: Two times a day (BID) | ORAL | Status: DC
  Filled 2018-01-18: qty 1

## 2018-01-18 MED ORDER — SODIUM CHLORIDE 0.9% FLUSH: 3.0000 mL | INTRAVENOUS | Status: DC | PRN

## 2018-01-18 MED ORDER — PRENATAL MULTIVITAMIN CH
1.0000 | ORAL_TABLET | Freq: Every day | ORAL | Status: DC
  Filled 2018-01-18: qty 1

## 2018-01-18 MED ORDER — ONDANSETRON HCL 4 MG PO TABS: 4.0000 mg | ORAL_TABLET | Freq: Three times a day (TID) | ORAL | Status: DC | PRN

## 2018-01-18 MED ORDER — ACETAMINOPHEN 325 MG PO TABS
650.0000 mg | ORAL_TABLET | ORAL | Status: DC | PRN
  Administered 2018-01-18 (×2): 650 mg via ORAL
  Filled 2018-01-18 (×2): qty 2

## 2018-01-18 MED ORDER — DOXYLAMINE SUCCINATE (SLEEP) 25 MG PO TABS
25.0000 mg | ORAL_TABLET | Freq: Every evening | ORAL | Status: DC | PRN
  Filled 2018-01-18: qty 1

## 2018-01-18 MED ORDER — SODIUM CHLORIDE 0.9 % IV SOLN: 250.0000 mL | INTRAVENOUS | Status: DC | PRN

## 2018-01-18 MED ORDER — ACEBUTOLOL HCL 200 MG PO CAPS
200.0000 mg | ORAL_CAPSULE | Freq: Every day | ORAL | Status: DC
  Filled 2018-01-18: qty 1

## 2018-01-18 MED ORDER — SODIUM CHLORIDE 0.9% FLUSH: 3.0000 mL | Freq: Two times a day (BID) | INTRAVENOUS | Status: DC

## 2018-01-18 MED ORDER — DOCUSATE SODIUM 100 MG PO CAPS
100.0000 mg | ORAL_CAPSULE | Freq: Every day | ORAL | Status: DC
  Filled 2018-01-18: qty 1

## 2018-01-18 MED ORDER — GABAPENTIN 600 MG PO TABS
600.0000 mg | ORAL_TABLET | Freq: Every day | ORAL | Status: DC | PRN
  Filled 2018-01-18: qty 1

## 2018-01-18 MED ORDER — ZOLPIDEM TARTRATE 5 MG PO TABS: 5.0000 mg | ORAL_TABLET | Freq: Every evening | ORAL | 0 refills | Status: DC | PRN

## 2018-01-18 NOTE — Progress Notes (Signed)
Antepartum Note- PPROM @ [redacted]w[redacted]d  S: Pt resting comfortably.  Denies further leakage of fluid this am.  Note some lower abdominal cramping, but minimal.  No vaginal bleeding, +FM.  No fever or chills.  No acute complaints  O: BP 117/66 (BP Location: Left Arm)   Pulse 87   Temp 98.5 F (36.9 C) (Oral)   Resp 20   Ht 5' 4.5" (1.638 m)   Wt 71.7 kg   LMP 09/06/2017   SpO2 100%   BMI 26.70 kg/m   Gen: NAD CV: RRR Lungs: normal respiratory effort Abd: gravid, non-tender FHT: 148 by doppler Ext: no edema or calf tenderness  Results for orders placed or performed during the hospital encounter of 01/17/18 (from the past 24 hour(s))  Urinalysis, Routine w reflex microscopic     Status: None   Collection Time: 01/17/18 11:40 PM  Result Value Ref Range   Color, Urine YELLOW YELLOW   APPearance CLEAR CLEAR   Specific Gravity, Urine 1.020 1.005 - 1.030   pH 6.0 5.0 - 8.0   Glucose, UA NEGATIVE NEGATIVE mg/dL   Hgb urine dipstick NEGATIVE NEGATIVE   Bilirubin Urine NEGATIVE NEGATIVE   Ketones, ur NEGATIVE NEGATIVE mg/dL   Protein, ur NEGATIVE NEGATIVE mg/dL   Nitrite NEGATIVE NEGATIVE   Leukocytes, UA NEGATIVE NEGATIVE  Type and screen [facility]     Status: None   Collection Time: 01/18/18  2:10 AM  Result Value Ref Range   ABO/RH(D) O POS    Antibody Screen NEG    Sample Expiration      01/21/2018 Performed at [facility], [address] [place], [place] [address]   CBC with Differential/Platelet     Status: Abnormal   Collection Time: 01/18/18  2:10 AM  Result Value Ref Range   WBC 12.9 (H) 4.0 - 10.5 K/uL   RBC 3.06 (L) 3.87 - 5.11 MIL/uL   Hemoglobin 10.2 (L) 12.0 - 15.0 g/dL   HCT 27.7 (L) 41.2 - 87.8 %   MCV 101.0 (H) 80.0 - 100.0 fL   MCH 33.3 26.0 - 34.0 pg   MCHC 33.0 30.0 - 36.0 g/dL   RDW 67.6 72.0 - 94.7 %   Platelets 258 150 - 400 K/uL   nRBC 0.0 0.0 - 0.2 %   Neutrophils Relative % 75 %   Neutro Abs 9.6 (H) 1.7 - 7.7 K/uL   Lymphocytes Relative 20 %   Lymphs Abs 2.6 0.7 - 4.0 K/uL   Monocytes Relative 4 %   Monocytes Absolute 0.5 0.1 - 1.0 K/uL   Eosinophils Relative 1 %   Eosinophils Absolute 0.2 0.0 - 0.5 K/uL   Basophils Relative 0 %   Basophils Absolute 0.0 0.0 - 0.1 K/uL    A/P: 38 y.o. G2P1001 @ [redacted]w[redacted]d for PPROM at previable gestation -No evidence of infection or labor currently -Patient has reviewed plan with Dr. Grace Bushy this am.  Plan for discharge home today and follow up with MFM on Monday for repeat US and to review management plan -Pt to call should she note signs of labor or infection.  Plan to check temp at home daily  Myna Hidalgo, DO [phone] (cell) [phone] (office)

## 2018-01-18 NOTE — Plan of Care (Signed)
Discharged ambulatory with spouse

## 2018-01-18 NOTE — MAU Provider Note (Signed)
History    CSN: [id number] Arrival date and time: 01/17/18 2325 First Provider Initiated Contact with Patient 01/18/18 0004     Chief Complaint  Patient presents with  . Vaginal Discharge   HPI  Penny Morrison is a 37yo G2P1001 at [redacted]w[redacted]d who presents to the MAU with concerns for breaking her water. She states she noticed a lot of fluid leaking earlier today that soaked through her clothing. She states now the leaking is more of a trickle. She states she is nervous about what this means. Had amniocentesis two days ago because of risk NIPS. Reports amniocentesis confirmed Trisomy 21. Denies vaginal bleeding, contractions. Uncomplicated first pregnancy.   OB History    Gravida  2   Para  1   Term  1   Preterm      AB      Living  1     SAB      TAB      Ectopic      Multiple      Live Births  1           Past Medical History:  Diagnosis Date  . Gestational diabetes   . Hypertension   . Leukocytopenia, unspecified 11/10/2012  . Leukopenia   . Pregnancy induced hypertension   . Tachycardia   . Vertigo     Past Surgical History:  Procedure Laterality Date  . NO PAST SURGERIES      Family History  Problem Relation Age of Onset  . Cancer Maternal Grandmother        colon ca  . Hypertension Maternal Grandmother   . Healthy Mother   . Healthy Father     Social History   Tobacco Use  . Smoking status: Never Smoker  . Smokeless tobacco: Never Used  Substance Use Topics  . Alcohol use: No  . Drug use: No    Allergies: No Known Allergies  Medications Prior to Admission  Medication Sig Dispense Refill Last Dose  . acebutolol (SECTRAL) 200 MG capsule Take 200 mg by mouth at bedtime.   01/17/2018 at Unknown time  . acetaminophen-codeine (TYLENOL #3) 300-30 MG per tablet Take 1 tablet by mouth every 4 (four) hours as needed. For pain   01/17/2018 at Unknown time  . GABAPENTIN PO Take by mouth.   01/17/2018 at Unknown time  . Prenatal MV-Min-Fe Fum-FA-DHA  (PRENATAL+DHA PO) Take 1 each by mouth daily.   01/17/2018 at Unknown time  . cholecalciferol (VITAMIN D) 1000 UNITS tablet Take 1,000 Units by mouth at bedtime.   More than a month at Unknown time  . diphenhydramine-acetaminophen (TYLENOL PM) 25-500 MG TABS tablet Take 2 tablets by mouth at bedtime as needed.   Taking  . doxylamine, Sleep, (UNISOM) 25 MG tablet Take 25 mg by mouth at bedtime as needed.   Not Taking  . NIFEdipine (PROCARDIA-XL/NIFEDICAL-XL) 30 MG 24 hr tablet Take 30 mg by mouth daily.   More than a month at Unknown time  . Prenatal Vit-Fe Fumarate-FA (PRENATAL VITAMIN PO) Take by mouth.   Unknown at Unknown time  . Vitamin D, Ergocalciferol, (DRISDOL) 50000 units CAPS capsule Take 50,000 Units by mouth every 7 (seven) days. On Sundays   Unknown at Unknown time  . zolpidem (AMBIEN) 5 MG tablet Take 1 tablet (5 mg total) by mouth at bedtime as needed for sleep. (Patient not taking: Reported on 01/15/2018) 6 tablet 0 More than a month at Unknown time   Review of Systems  Constitutional: Negative for activity change and fever.  Gastrointestinal: Positive for abdominal pain. Negative for nausea and vomiting.  Genitourinary: Negative for dysuria, pelvic pain and vaginal bleeding.  Musculoskeletal: Negative for back pain.  Neurological: Negative for dizziness and light-headedness.  Psychiatric/Behavioral: The patient is nervous/anxious.    Physical Exam   Blood pressure 129/82, pulse 92, temperature 98.1 F (36.7 C), resp. rate 18, height 5' 4.5" (1.638 m), weight 71.7 kg, last menstrual period 09/06/2017.  Physical Exam  Nursing note and vitals reviewed. Constitutional: She is oriented to person, place, and time.  Nervous appearing, NAD, tearful  HENT:  Head: Normocephalic and atraumatic.  Eyes: Conjunctivae and EOM are normal.  Cardiovascular: Normal rate.  Respiratory: No respiratory distress.  GI: There is no abdominal tenderness.  Genitourinary:    Genitourinary  Comments: Normal appearing labia  normal appearing vaginal mucosa with pooling in canal and increased with valsalva  cervix appears visually closed    Musculoskeletal:        General: No edema.  Neurological: She is alert and oriented to person, place, and time.  Psychiatric: She has a normal mood and affect. Her behavior is normal.    MAU Course  Procedures  MDM -- grossly ruptured on exam -- consulted with Dr. Shawnie PonsPratt who recommended consulting physician on call for practice -- consulted Dr. Charlotta Newtonzan who will come to see the patient  Assessment and Plan  37yo G2P1001 at 2469w1d with confirmed previable PPROM on exam. Dr. Charlotta Newtonzan to admit patient overnight for observation and MFM consult in morning.   Tamera StandsLaurel S Froylan Hobby, DO 01/18/2018, 2:56 AM

## 2018-01-18 NOTE — Progress Notes (Signed)
Patient discharged with spouse, discharge instructions included s/s infection and checking temp QD and prn. Pt instructed to return if labor started, or if VB. Ot to follow-up with Dr. Judeth CornfieldShankar Monday morning. Pt verbalizes understanding.

## 2018-01-18 NOTE — H&P (Signed)
HPI: 38 y/o G2P1001 @ [redacted]w[redacted]d estimated gestational age (as dated by LMP c/w 19 week ultrasound) presents complaining of rupture fluids.  Around 930 last night she noted a large gush that soaked her pants.  She continues to leak fluid since that time.  Notes slight cramp on her right side, but minimal discomfort. Denies painful contractions.     no Vaginal Bleeding,    + Fetal Movement.  Prenatal care has been provided by Dr. Richardson Dopp  ROS:  Const: no fever or chills Neuro: no headache HEENT: no blurry or change in vision Cardio: no chest pain, no edema Pulm: no SOB GI: no nausea/vomiting, no change in bowel movement GU: denies abnormal discharge, itching or irritation  Pregnancy complicated by: 1) chronic HTN- on Labetalol 200mg  bid 2) T21- abnormal panorama and amniocentesis confirm T21 (consult 1/15) 3) Palpitations- on Acebutolol 200mg  daily 4) Facial neuropathy: Gabapentin as needed   PNL:  GBS unknown, Rub Immune, Hep B neg, RPR NR, HIV neg, GC/C neg, glucola:not yet collected Blood type: O positive, antibody neg  OBHx: 2012- FTNSVD, 7#8oz, complicated by cHTN and gestation DM PMHx:  Chronic HTN, neuropathy Meds:  PNV, Labetalol acebutolol, gabapentin Allergy:  No Known Allergies SurgHx: none SocHx:   no Tobacco, no  EtOH, no Illicit Drugs  O: BP 129/82   Pulse 92   Temp 98.1 F (36.7 C)   Resp 18   Ht 5' 4.5" (1.638 m)   Wt 71.7 kg   LMP 09/06/2017   BMI 26.70 kg/m  Gen. NAD HEENT: unremarkable Neck: normal appearance CV.  RRR  Resp. CTAB, no wheeze or crackles. Abd. Gravid,  no tenderness,  no rigidity,  no guarding GU: Deferred Extr.  no edema B/L , no calf tenderness, neg Homan's B/L FHT: 140 by doppler Psych: appropriate mood and affect for current situation Neuro: AAOx3,  Labs:  Results for orders placed or performed during the hospital encounter of 01/17/18 (from the past 24 hour(s))  Urinalysis, Routine w reflex microscopic     Status: None   Collection  Time: 01/17/18 11:40 PM  Result Value Ref Range   Color, Urine YELLOW YELLOW   APPearance CLEAR CLEAR   Specific Gravity, Urine 1.020 1.005 - 1.030   pH 6.0 5.0 - 8.0   Glucose, UA NEGATIVE NEGATIVE mg/dL   Hgb urine dipstick NEGATIVE NEGATIVE   Bilirubin Urine NEGATIVE NEGATIVE   Ketones, ur NEGATIVE NEGATIVE mg/dL   Protein, ur NEGATIVE NEGATIVE mg/dL   Nitrite NEGATIVE NEGATIVE   Leukocytes, UA NEGATIVE NEGATIVE     A/P:  38 y.o. G2P1001 @ 103w1d EGA who presents for preterm premature rupture of membranes -Admit to antepartum -Plan to r/o underlying infection  UA neg, GBS collected, CBC to be obtained -Fetal monitoring q shift -MFM consultation in am with ultrasound -Discussed management options with patient regarding expectant management vs proceeding with delivery.  Questions and concerns were addressed at this time.  Plan for final decision once reviewed care with MFM -Maternal care  General diet  Activity as tolerated  Tylenol as needed -cHTN- continue Labetalol 200mg  bid -Palpitations- continue Acebutolol -Neuropathy- continue gabapentin as needed  Myna Hidalgo, DO [phone] (cell) [phone] (office)

## 2018-01-18 NOTE — Discharge Instructions (Addendum)
Premature Rupture and Preterm Premature Rupture of Membranes  Rupture of membranes is when the membranes (amniotic sac) that hold your baby break open. This is commonly referred to as your "water breaking." If your water breaks before labor starts (prematurely), it is called premature rupture of membranes (PROM). If PROM occurs before 37 weeks of pregnancy, it is called preterm premature rupture of membranes (PPROM). Because the amniotic sac keeps infection out and performs other important functions, having the amniotic sac rupture before 37 weeks of pregnancy can lead to serious problems. It requires immediate attention from a health care provider. What are the causes? When PROM occurs at 37 weeks of pregnancy or later, it is usually caused by natural weakening of the membranes and friction caused by contractions. PPROM is usually caused by infection. In many cases, the cause is not known. What increases the risk of PPROM? The following factors may make you more likely to have PPROM:  Infection.  Having had PPROM in a previous pregnancy.  Short cervical length.  Bleeding during the second or third trimester.  Low BMI, which is an estimate of body fat.  Smoking.  Using drugs.  Low socioeconomic status. What problems can be caused by PROM and PPROM? This condition creates health dangers for the mother and the baby. These include:  Delivering a premature baby.  Getting a serious infection of the placental tissues (chorioamnionitis).  Early detachment of the placenta from the uterus (placental abruption).  Compression of the umbilical cord.  Developing a serious infection after delivery. What happens if I am diagnosed with PROM or PPROM? Once you arrive at the hospital, you will have tests done. A cervical exam will be done using a lubricated instrument (speculum) to check whether the cervix has softened or started to open (dilate).  If you are diagnosed with PROM, your labor may  be started for you (you may be induced) within 24 hours if you are not having contractions.  If you are diagnosed with PPROM and you are not having contractions, you may be induced depending on your trimester. If you have PPROM:  You and your baby will be monitored closely for signs of infection or other complications.  You may be given: ? An antibiotic medicine to lower the chances of developing an infection. ? A steroid medicine to help mature the baby's lungs more quickly. ? A medicine to help prevent cerebral palsy in your baby. ? A medicine to stop preterm labor.  You may be ordered to be on bed rest at home or in the hospital.  You may be induced if complications occur for you or the baby. When should I call the doctor? -If you note a fever greater than 100.4 -If you start having painful contractions every 5 mins -If you have heavy vaginal bleeding  Your treatment will depend on many factors, such as how many weeks you have been pregnant (how far along you are), the development of the baby, and other complications that may occur. This information is not intended to replace advice given to you by your health care provider. Make sure you discuss any questions you have with your health care provider.   Document Released: 12/18/2004 Document Revised: 08/03/2016 Document Reviewed: 07/25/2015 Elsevier Interactive Patient Education  2019 [employer].

## 2018-01-20 ENCOUNTER — Ambulatory Visit (HOSPITAL_COMMUNITY)
Admission: RE | Admit: 2018-01-20 | Discharge: 2018-01-20 | Disposition: A | Discharge status: Home / Self Care | Payer: Managed Care, Other (non HMO) | Source: Ambulatory Visit | Attending: Nurse Practitioner | Admitting: Nurse Practitioner

## 2018-01-20 ENCOUNTER — Encounter (HOSPITAL_COMMUNITY): Payer: Managed Care, Other (non HMO)

## 2018-01-20 ENCOUNTER — Ambulatory Visit (HOSPITAL_COMMUNITY): Payer: Managed Care, Other (non HMO)

## 2018-01-20 ENCOUNTER — Other Ambulatory Visit (HOSPITAL_COMMUNITY): Payer: Self-pay | Admitting: *Deleted

## 2018-01-20 ENCOUNTER — Encounter (HOSPITAL_COMMUNITY): Payer: Self-pay

## 2018-01-20 DIAGNOSIS — O09292 Supervision of pregnancy with other poor reproductive or obstetric history, second trimester: Secondary | ICD-10-CM

## 2018-01-20 DIAGNOSIS — O289 Unspecified abnormal findings on antenatal screening of mother: Secondary | ICD-10-CM

## 2018-01-20 DIAGNOSIS — O09522 Supervision of elderly multigravida, second trimester: Secondary | ICD-10-CM

## 2018-01-20 DIAGNOSIS — O351XX0 Maternal care for (suspected) chromosomal abnormality in fetus, not applicable or unspecified: Secondary | ICD-10-CM

## 2018-01-20 DIAGNOSIS — O42919 Preterm premature rupture of membranes, unspecified as to length of time between rupture and onset of labor, unspecified trimester: Secondary | ICD-10-CM

## 2018-01-20 DIAGNOSIS — Z362 Encounter for other antenatal screening follow-up: Secondary | ICD-10-CM | POA: Diagnosis not present

## 2018-01-20 DIAGNOSIS — O10012 Pre-existing essential hypertension complicating pregnancy, second trimester: Secondary | ICD-10-CM

## 2018-01-20 DIAGNOSIS — Z3A19 19 weeks gestation of pregnancy: Secondary | ICD-10-CM

## 2018-01-20 LAB — CULTURE, BETA STREP (GROUP B ONLY)

## 2018-01-31 DIAGNOSIS — O24419 Gestational diabetes mellitus in pregnancy, unspecified control: Secondary | ICD-10-CM | POA: Insufficient documentation

## 2018-02-05 NOTE — Discharge Summary (Signed)
Physician Discharge Summary  Patient ID: Penny Morrison MRN: [id number] DOB/AGE: 37-Aug-1982 37 y.o.  Admit date: 01/17/2018 Discharge date: 01/18/18  Admission Diagnoses: Preterm, previable rupture of membranes  Discharge Diagnoses:  Active Problems:   Preterm premature rupture of membranes (PPROM) with unknown onset of labor   Discharged Condition: stable  Hospital Course: 38 y/o G2P1001 @ [redacted]w[redacted]d estimated gestational age (as dated by LMP c/w 19 week ultrasound) was admitted due to rupture of membranes.  She was monitored for signs of labor or infection.  Options were reviewed with patient and with time, leaking had decreased.  After consultation with MFM, plan was for out patient follow-up for further guidance.  Consults: MFM  Significant Diagnostic Studies: labs: UA neg, GBS neg  Treatments: IV hydration  Discharge Exam: Blood pressure 117/66, pulse 87, temperature 98.5 F (36.9 C), temperature source Oral, resp. rate 20, height 5' 4.5" (1.638 m), weight 71.7 kg, last menstrual period 09/06/2017, SpO2 100 %.  Gen: NAD CV: RRR Lungs: normal respiratory effort Abd: gravid, non-tender FHT: 148 by doppler Ext: no edema or calf tenderness   Disposition: Discharge disposition: 01-Home or Self Care        Allergies as of 01/18/2018   No Known Allergies     Medication List    STOP taking these medications   acetaminophen-codeine 300-30 MG tablet Commonly known as:  TYLENOL #3   cholecalciferol 1000 units tablet Commonly known as:  VITAMIN D   doxylamine (Sleep) 25 MG tablet Commonly known as:  UNISOM   NIFEdipine 30 MG 24 hr tablet Commonly known as:  PROCARDIA-XL/NIFEDICAL-XL   PRENATAL VITAMIN PO     TAKE these medications   acebutolol 200 MG capsule Commonly known as:  SECTRAL Take 200 mg by mouth at bedtime.   diphenhydramine-acetaminophen 25-500 MG Tabs tablet Commonly known as:  TYLENOL PM Take 2 tablets by mouth at bedtime as needed.    GABAPENTIN PO Take by mouth.   PRENATAL+DHA PO Take 1 each by mouth daily.   Vitamin D (Ergocalciferol) 1.25 MG (50000 UT) Caps capsule Commonly known as:  DRISDOL Take 50,000 Units by mouth every 7 (seven) days. On Sundays   zolpidem 5 MG tablet Commonly known as:  AMBIEN Take 1 tablet (5 mg total) by mouth at bedtime as needed for sleep.      Follow-up Information    Noralee Space, MD Follow up in 2 day(s).   Specialty:  Obstetrics and Gynecology Why:  Plan to follow up wtih MFM on Monday morning between 9-11am.  Please call office first thing on Monday morning Contact information: [address] [place] [address] [phone]           Signed: Sharon Seller 02/05/2018, 6:41 AM

## 2018-02-12 ENCOUNTER — Ambulatory Visit (HOSPITAL_COMMUNITY)
Admission: RE | Admit: 2018-02-12 | Discharge: 2018-02-12 | Disposition: A | Discharge status: Home / Self Care | Payer: Managed Care, Other (non HMO) | Source: Ambulatory Visit | Attending: Obstetrics and Gynecology | Admitting: Obstetrics and Gynecology

## 2018-02-12 ENCOUNTER — Encounter (HOSPITAL_COMMUNITY): Payer: Self-pay

## 2018-02-12 ENCOUNTER — Other Ambulatory Visit (HOSPITAL_COMMUNITY): Payer: Self-pay | Admitting: *Deleted

## 2018-02-12 DIAGNOSIS — O09522 Supervision of elderly multigravida, second trimester: Secondary | ICD-10-CM | POA: Diagnosis not present

## 2018-02-12 DIAGNOSIS — O351XX0 Maternal care for (suspected) chromosomal abnormality in fetus, not applicable or unspecified: Secondary | ICD-10-CM

## 2018-02-12 DIAGNOSIS — O09292 Supervision of pregnancy with other poor reproductive or obstetric history, second trimester: Secondary | ICD-10-CM | POA: Diagnosis not present

## 2018-02-12 DIAGNOSIS — O10012 Pre-existing essential hypertension complicating pregnancy, second trimester: Secondary | ICD-10-CM | POA: Diagnosis not present

## 2018-02-12 DIAGNOSIS — Z3A22 22 weeks gestation of pregnancy: Secondary | ICD-10-CM

## 2018-02-12 DIAGNOSIS — O289 Unspecified abnormal findings on antenatal screening of mother: Secondary | ICD-10-CM | POA: Diagnosis not present

## 2018-02-12 DIAGNOSIS — O285 Abnormal chromosomal and genetic finding on antenatal screening of mother: Secondary | ICD-10-CM | POA: Diagnosis not present

## 2018-02-12 DIAGNOSIS — O351XX2 Maternal care for (suspected) chromosomal abnormality in fetus, fetus 2: Secondary | ICD-10-CM

## 2018-02-12 DIAGNOSIS — O3510X0 Maternal care for (suspected) chromosomal abnormality in fetus, unspecified, not applicable or unspecified: Secondary | ICD-10-CM

## 2018-02-12 DIAGNOSIS — Z8279 Family history of other congenital malformations, deformations and chromosomal abnormalities: Secondary | ICD-10-CM

## 2018-03-12 ENCOUNTER — Other Ambulatory Visit (HOSPITAL_COMMUNITY): Payer: Self-pay | Admitting: *Deleted

## 2018-03-12 ENCOUNTER — Encounter (HOSPITAL_COMMUNITY): Payer: Self-pay

## 2018-03-12 ENCOUNTER — Ambulatory Visit (HOSPITAL_COMMUNITY)
Admission: RE | Admit: 2018-03-12 | Discharge: 2018-03-12 | Disposition: A | Discharge status: Home / Self Care | Payer: Managed Care, Other (non HMO) | Source: Ambulatory Visit | Attending: Obstetrics and Gynecology | Admitting: Obstetrics and Gynecology

## 2018-03-12 ENCOUNTER — Other Ambulatory Visit: Payer: Self-pay

## 2018-03-12 ENCOUNTER — Ambulatory Visit (HOSPITAL_COMMUNITY): Payer: Managed Care, Other (non HMO) | Admitting: *Deleted

## 2018-03-12 VITALS — BP 126/75 | HR 88 | Wt 181.0 lb

## 2018-03-12 DIAGNOSIS — Z8279 Family history of other congenital malformations, deformations and chromosomal abnormalities: Secondary | ICD-10-CM

## 2018-03-12 DIAGNOSIS — O409XX0 Polyhydramnios, unspecified trimester, not applicable or unspecified: Secondary | ICD-10-CM

## 2018-03-12 DIAGNOSIS — O359XX0 Maternal care for (suspected) fetal abnormality and damage, unspecified, not applicable or unspecified: Secondary | ICD-10-CM

## 2018-03-12 DIAGNOSIS — O289 Unspecified abnormal findings on antenatal screening of mother: Secondary | ICD-10-CM

## 2018-03-12 DIAGNOSIS — O09292 Supervision of pregnancy with other poor reproductive or obstetric history, second trimester: Secondary | ICD-10-CM

## 2018-03-12 DIAGNOSIS — O351XX0 Maternal care for (suspected) chromosomal abnormality in fetus, not applicable or unspecified: Secondary | ICD-10-CM | POA: Insufficient documentation

## 2018-03-12 DIAGNOSIS — O10012 Pre-existing essential hypertension complicating pregnancy, second trimester: Secondary | ICD-10-CM | POA: Diagnosis not present

## 2018-03-12 DIAGNOSIS — Z3A26 26 weeks gestation of pregnancy: Secondary | ICD-10-CM

## 2018-03-12 DIAGNOSIS — Z362 Encounter for other antenatal screening follow-up: Secondary | ICD-10-CM | POA: Diagnosis not present

## 2018-03-12 DIAGNOSIS — O3510X0 Maternal care for (suspected) chromosomal abnormality in fetus, unspecified, not applicable or unspecified: Secondary | ICD-10-CM

## 2018-03-12 DIAGNOSIS — O09522 Supervision of elderly multigravida, second trimester: Secondary | ICD-10-CM | POA: Diagnosis not present

## 2018-04-03 ENCOUNTER — Other Ambulatory Visit: Payer: Self-pay

## 2018-04-03 ENCOUNTER — Ambulatory Visit (HOSPITAL_COMMUNITY)
Admission: RE | Admit: 2018-04-03 | Discharge: 2018-04-03 | Disposition: A | Discharge status: Home / Self Care | Payer: Managed Care, Other (non HMO) | Source: Ambulatory Visit | Attending: Obstetrics and Gynecology | Admitting: Obstetrics and Gynecology

## 2018-04-03 ENCOUNTER — Other Ambulatory Visit (HOSPITAL_COMMUNITY): Payer: Self-pay | Admitting: *Deleted

## 2018-04-03 ENCOUNTER — Encounter (HOSPITAL_COMMUNITY): Payer: Self-pay

## 2018-04-03 ENCOUNTER — Ambulatory Visit (HOSPITAL_COMMUNITY): Payer: Managed Care, Other (non HMO) | Admitting: *Deleted

## 2018-04-03 VITALS — BP 131/82 | HR 95 | Temp 97.8°F

## 2018-04-03 DIAGNOSIS — O289 Unspecified abnormal findings on antenatal screening of mother: Secondary | ICD-10-CM | POA: Diagnosis not present

## 2018-04-03 DIAGNOSIS — O10013 Pre-existing essential hypertension complicating pregnancy, third trimester: Secondary | ICD-10-CM

## 2018-04-03 DIAGNOSIS — O409XX0 Polyhydramnios, unspecified trimester, not applicable or unspecified: Secondary | ICD-10-CM | POA: Insufficient documentation

## 2018-04-03 DIAGNOSIS — O09523 Supervision of elderly multigravida, third trimester: Secondary | ICD-10-CM

## 2018-04-03 DIAGNOSIS — O351XX0 Maternal care for (suspected) chromosomal abnormality in fetus, not applicable or unspecified: Secondary | ICD-10-CM

## 2018-04-03 DIAGNOSIS — Z362 Encounter for other antenatal screening follow-up: Secondary | ICD-10-CM

## 2018-04-03 DIAGNOSIS — O0990 Supervision of high risk pregnancy, unspecified, unspecified trimester: Secondary | ICD-10-CM | POA: Diagnosis present

## 2018-04-03 DIAGNOSIS — O09293 Supervision of pregnancy with other poor reproductive or obstetric history, third trimester: Secondary | ICD-10-CM

## 2018-04-03 DIAGNOSIS — Z8279 Family history of other congenital malformations, deformations and chromosomal abnormalities: Secondary | ICD-10-CM

## 2018-04-03 DIAGNOSIS — Z3A29 29 weeks gestation of pregnancy: Secondary | ICD-10-CM

## 2018-04-09 ENCOUNTER — Ambulatory Visit (HOSPITAL_COMMUNITY): Payer: Managed Care, Other (non HMO)

## 2018-05-07 ENCOUNTER — Encounter (HOSPITAL_COMMUNITY): Payer: Self-pay

## 2018-05-07 ENCOUNTER — Inpatient Hospital Stay (HOSPITAL_COMMUNITY)
Admission: AD | Admit: 2018-05-07 | Discharge: 2018-05-07 | Disposition: A | Discharge status: Home / Self Care | Payer: Managed Care, Other (non HMO) | Attending: Obstetrics & Gynecology | Admitting: Obstetrics & Gynecology

## 2018-05-07 ENCOUNTER — Other Ambulatory Visit: Payer: Self-pay

## 2018-05-07 DIAGNOSIS — O26893 Other specified pregnancy related conditions, third trimester: Secondary | ICD-10-CM | POA: Diagnosis present

## 2018-05-07 DIAGNOSIS — Z79899 Other long term (current) drug therapy: Secondary | ICD-10-CM | POA: Insufficient documentation

## 2018-05-07 DIAGNOSIS — O4703 False labor before 37 completed weeks of gestation, third trimester: Secondary | ICD-10-CM | POA: Diagnosis not present

## 2018-05-07 DIAGNOSIS — R102 Pelvic and perineal pain: Secondary | ICD-10-CM | POA: Insufficient documentation

## 2018-05-07 DIAGNOSIS — O1403 Mild to moderate pre-eclampsia, third trimester: Secondary | ICD-10-CM | POA: Diagnosis not present

## 2018-05-07 DIAGNOSIS — Z3A34 34 weeks gestation of pregnancy: Secondary | ICD-10-CM | POA: Diagnosis not present

## 2018-05-07 LAB — CBC WITH DIFFERENTIAL/PLATELET
Abs Immature Granulocytes: 0.06 10*3/uL (ref 0.00–0.07)
Basophils Absolute: 0 10*3/uL (ref 0.0–0.1)
Basophils Relative: 0 %
Eosinophils Absolute: 0.1 10*3/uL (ref 0.0–0.5)
Eosinophils Relative: 1 %
HCT: 32.1 % — ABNORMAL LOW (ref 36.0–46.0)
Hemoglobin: 10.5 g/dL — ABNORMAL LOW (ref 12.0–15.0)
Immature Granulocytes: 1 %
Lymphocytes Relative: 22 %
Lymphs Abs: 1.8 10*3/uL (ref 0.7–4.0)
MCH: 31.3 pg (ref 26.0–34.0)
MCHC: 32.7 g/dL (ref 30.0–36.0)
MCV: 95.5 fL (ref 80.0–100.0)
Monocytes Absolute: 0.7 10*3/uL (ref 0.1–1.0)
Monocytes Relative: 8 %
Neutro Abs: 5.3 10*3/uL (ref 1.7–7.7)
Neutrophils Relative %: 68 %
Platelets: 214 10*3/uL (ref 150–400)
RBC: 3.36 MIL/uL — ABNORMAL LOW (ref 3.87–5.11)
RDW: 15.9 % — ABNORMAL HIGH (ref 11.5–15.5)
WBC: 7.8 10*3/uL (ref 4.0–10.5)
nRBC: 0 % (ref 0.0–0.2)

## 2018-05-07 LAB — COMPREHENSIVE METABOLIC PANEL
ALT: 8 U/L (ref 0–44)
AST: 13 U/L — ABNORMAL LOW (ref 15–41)
Albumin: 2.7 g/dL — ABNORMAL LOW (ref 3.5–5.0)
Alkaline Phosphatase: 91 U/L (ref 38–126)
Anion gap: 12 (ref 5–15)
BUN: <5 mg/dL — ABNORMAL LOW (ref 6–20)
CO2: 21 mmol/L — ABNORMAL LOW (ref 22–32)
Calcium: 8.7 mg/dL — ABNORMAL LOW (ref 8.9–10.3)
Chloride: 105 mmol/L (ref 98–111)
Creatinine, Ser: 0.7 mg/dL (ref 0.44–1.00)
GFR calc Af Amer: >60 mL/min (ref >60)
GFR calc non Af Amer: >60 mL/min (ref >60)
Glucose, Bld: 105 mg/dL — ABNORMAL HIGH (ref 70–99)
Potassium: 2.9 mmol/L — ABNORMAL LOW (ref 3.5–5.1)
Sodium: 138 mmol/L (ref 135–145)
Total Bilirubin: 0.3 mg/dL (ref 0.3–1.2)
Total Protein: 6.6 g/dL (ref 6.5–8.1)

## 2018-05-07 LAB — URINALYSIS, ROUTINE W REFLEX MICROSCOPIC
Bilirubin Urine: NEGATIVE
Glucose, UA: NEGATIVE mg/dL
Hgb urine dipstick: NEGATIVE
Ketones, ur: NEGATIVE mg/dL
Leukocytes,Ua: NEGATIVE
Nitrite: NEGATIVE
Protein, ur: NEGATIVE mg/dL
Specific Gravity, Urine: 1.005 (ref 1.005–1.030)
pH: 7 (ref 5.0–8.0)

## 2018-05-07 LAB — RAPID URINE DRUG SCREEN, HOSP PERFORMED
Amphetamines: NOT DETECTED
Barbiturates: NOT DETECTED
Benzodiazepines: NOT DETECTED
Cocaine: NOT DETECTED
Opiates: NOT DETECTED
Tetrahydrocannabinol: NOT DETECTED

## 2018-05-07 LAB — PROTEIN / CREATININE RATIO, URINE
Creatinine, Urine: 56.36 mg/dL
Protein Creatinine Ratio: 0.51 mg/mg{Cre} — ABNORMAL HIGH (ref 0.00–0.15)
Total Protein, Urine: 29 mg/dL

## 2018-05-07 MED ORDER — LACTATED RINGERS IV BOLUS
1000.0000 mL | Freq: Once | INTRAVENOUS | Status: AC
  Administered 2018-05-07: 21:00:00 1000 mL via INTRAVENOUS

## 2018-05-07 MED ORDER — LABETALOL HCL 200 MG PO TABS: 100.0000 mg | ORAL_TABLET | Freq: Two times a day (BID) | ORAL | 0 refills | Status: DC

## 2018-05-07 MED ORDER — BETAMETHASONE SOD PHOS & ACET 6 (3-3) MG/ML IJ SUSP
12.0000 mg | Freq: Once | INTRAMUSCULAR | Status: AC
  Administered 2018-05-07: 12 mg via INTRAMUSCULAR
  Filled 2018-05-07: qty 2

## 2018-05-07 NOTE — MAU Provider Note (Signed)
History     CSN: [id number]  Arrival date and time: 05/07/18 1901   First Provider Initiated Contact with Patient 05/07/18 1952      Chief Complaint  Patient presents with  . Contractions   Penny Morrison is a 38 y.o. G2P1001 at [redacted]w[redacted]d who presents today with contractions. She is followed by Tinnie Gens, and was last seen on 05/06/2018. Next visit is 05/13/2018. She has CHTN and is on labetalol 200mg  once per day. She reports normal fetal movement.   Pelvic Pain  The patient's primary symptoms include pelvic pain. The patient's pertinent negatives include no vaginal discharge. This is a new problem. The current episode started today (around 1615 ). The problem occurs intermittently. The problem has been gradually worsening. Pain severity now: 6/10. The problem affects both sides. She is pregnant. Pertinent negatives include no chills, dysuria, fever, frequency, nausea or vomiting. The vaginal discharge was normal. There has been no bleeding. Nothing aggravates the symptoms. She has tried nothing for the symptoms. Sexual activity: Denies intercourse in the last 24 hours.     OB History    Gravida  2   Para  1   Term  1   Preterm      AB      Living  1     SAB      TAB      Ectopic      Multiple      Live Births  1           Past Medical History:  Diagnosis Date  . Gestational diabetes   . Hypertension   . Leukocytopenia, unspecified 11/10/2012  . Leukopenia   . Pregnancy induced hypertension   . Tachycardia   . Vertigo     Past Surgical History:  Procedure Laterality Date  . NO PAST SURGERIES      Family History  Problem Relation Age of Onset  . Cancer Maternal Grandmother        colon ca  . Hypertension Maternal Grandmother   . Healthy Mother   . Healthy Father     Social History   Tobacco Use  . Smoking status: Never Smoker  . Smokeless tobacco: Never Used  Substance Use Topics  . Alcohol use: No  . Drug use: No    Allergies: No Known  Allergies  Medications Prior to Admission  Medication Sig Dispense Refill Last Dose  . acebutolol (SECTRAL) 200 MG capsule Take 200 mg by mouth at bedtime.   05/06/2018 at Unknown time  . diphenhydramine-acetaminophen (TYLENOL PM) 25-500 MG TABS tablet Take 2 tablets by mouth at bedtime as needed.   05/07/2018 at Unknown time  . Ferrous Sulfate (IRON PO) Take by mouth.   Past Week at Unknown time  . GABAPENTIN PO Take by mouth.   05/07/2018 at Unknown time  . hydrOXYzine (VISTARIL) 25 MG capsule Take 25 mg by mouth 3 (three) times daily as needed.   05/07/2018 at Unknown time  . LABETALOL HCL PO Take by mouth.   05/07/2018 at Unknown time  . Prenatal MV-Min-Fe Fum-FA-DHA (PRENATAL+DHA PO) Take 1 each by mouth daily.   05/07/2018 at Unknown time  . Vitamin D, Ergocalciferol, (DRISDOL) 50000 units CAPS capsule Take 50,000 Units by mouth every 7 (seven) days. On Sundays   05/06/2018 at Unknown time  . zolpidem (AMBIEN) 5 MG tablet Take 1 tablet (5 mg total) by mouth at bedtime as needed for sleep. (Patient not taking: Reported on 03/12/2018) 5  tablet 0 Not Taking    Review of Systems  Constitutional: Negative for chills and fever.  Gastrointestinal: Negative for nausea and vomiting.  Genitourinary: Positive for pelvic pain. Negative for dysuria, frequency, vaginal bleeding and vaginal discharge.   Physical Exam   Blood pressure (!) 145/83, pulse 87, temperature 97.8 F (36.6 C), resp. rate 12, height  (1.676 m), weight 86.2 kg, last menstrual period 09/06/2017, SpO2 100 %.  Physical Exam  Nursing note and vitals reviewed. Constitutional: She is oriented to person, place, and time. She appears well-developed and well-nourished. No distress.  HENT:  Head: Normocephalic.  Cardiovascular: Normal rate.  Genitourinary:    Genitourinary Comments:  Dilation: Closed Effacement (%): Thick Station: -3 Exam by:: Thressa Sheller, CNM     Neurological: She is alert and oriented to person, place, and time.   Skin: Skin is warm and dry.  Psychiatric: She has a normal mood and affect.    NST:  Baseline: 135 Variability: moderate Accels: 15x15 Decels: none Toco: every 3-4 mins    Results for orders placed or performed during the hospital encounter of 05/07/18 (from the past 24 hour(s))  Urinalysis, Routine w reflex microscopic     Status: Abnormal   Collection Time: 05/07/18  7:18 PM  Result Value Ref Range   Color, Urine STRAW (A) YELLOW   APPearance CLEAR CLEAR   Specific Gravity, Urine 1.005 1.005 - 1.030   pH 7.0 5.0 - 8.0   Glucose, UA NEGATIVE NEGATIVE mg/dL   Hgb urine dipstick NEGATIVE NEGATIVE   Bilirubin Urine NEGATIVE NEGATIVE   Ketones, ur NEGATIVE NEGATIVE mg/dL   Protein, ur NEGATIVE NEGATIVE mg/dL   Nitrite NEGATIVE NEGATIVE   Leukocytes,Ua NEGATIVE NEGATIVE  Urine rapid drug screen (hosp performed)     Status: None   Collection Time: 05/07/18  7:51 PM  Result Value Ref Range   Opiates NONE DETECTED NONE DETECTED   Cocaine NONE DETECTED NONE DETECTED   Benzodiazepines NONE DETECTED NONE DETECTED   Amphetamines NONE DETECTED NONE DETECTED   Tetrahydrocannabinol NONE DETECTED NONE DETECTED   Barbiturates NONE DETECTED NONE DETECTED  Protein / creatinine ratio, urine     Status: Abnormal   Collection Time: 05/07/18  7:51 PM  Result Value Ref Range   Creatinine, Urine 56.36 mg/dL   Total Protein, Urine 29 mg/dL   Protein Creatinine Ratio 0.51 (H) 0.00 - 0.15 mg/mg[Cre]  CBC with Differential/Platelet     Status: Abnormal   Collection Time: 05/07/18  8:35 PM  Result Value Ref Range   WBC 7.8 4.0 - 10.5 K/uL   RBC 3.36 (L) 3.87 - 5.11 MIL/uL   Hemoglobin 10.5 (L) 12.0 - 15.0 g/dL   HCT 84.6 (L) 96.2 - 95.2 %   MCV 95.5 80.0 - 100.0 fL   MCH 31.3 26.0 - 34.0 pg   MCHC 32.7 30.0 - 36.0 g/dL   RDW 84.1 (H) 32.4 - 40.1 %   Platelets 214 150 - 400 K/uL   nRBC 0.0 0.0 - 0.2 %   Neutrophils Relative % 68 %   Neutro Abs 5.3 1.7 - 7.7 K/uL   Lymphocytes Relative 22  %   Lymphs Abs 1.8 0.7 - 4.0 K/uL   Monocytes Relative 8 %   Monocytes Absolute 0.7 0.1 - 1.0 K/uL   Eosinophils Relative 1 %   Eosinophils Absolute 0.1 0.0 - 0.5 K/uL   Basophils Relative 0 %   Basophils Absolute 0.0 0.0 - 0.1 K/uL   Immature Granulocytes 1 %  Abs Immature Granulocytes 0.06 0.00 - 0.07 K/uL  Comprehensive metabolic panel     Status: Abnormal   Collection Time: 05/07/18  8:35 PM  Result Value Ref Range   Sodium 138 135 - 145 mmol/L   Potassium 2.9 (L) 3.5 - 5.1 mmol/L   Chloride 105 98 - 111 mmol/L   CO2 21 (L) 22 - 32 mmol/L   Glucose, Bld 105 (H) 70 - 99 mg/dL   BUN <5 (L) 6 - 20 mg/dL   Creatinine, Ser 1.610.70 0.44 - 1.00 mg/dL   Calcium 8.7 (L) 8.9 - 10.3 mg/dL   Total Protein 6.6 6.5 - 8.1 g/dL   Albumin 2.7 (L) 3.5 - 5.0 g/dL   AST 13 (L) 15 - 41 U/L   ALT 8 0 - 44 U/L   Alkaline Phosphatase 91 38 - 126 U/L   Total Bilirubin 0.3 0.3 - 1.2 mg/dL   GFR calc non Af Amer >60 >60 mL/min   GFR calc Af Amer >60 >60 mL/min   Anion gap 12 5 - 15     MAU Course  Procedures  MDM Patient has had fluid bolus and contractions have spaced.   9:24 PM consult with Dr. Despina HiddenEure. Will increase labetalol to 200mg  BID (patient is only taking it once per day currently). BMZ today, and 24 hour urine collection instructions. Patient to return here tomorrow evening for 2nd BMZ and drop off 24 hour urine collection in the office on Friday.   9:34 PM left message with [facility] answering service. Lesly RubensteinJade is on call, and said she did not know how to get the message to Dr. Richardson Doppole. I will send a message to Dr. Richardson Doppole.   Assessment and Plan   1. Antepartum mild preeclampsia, third trimester   2. [redacted] weeks gestation of pregnancy    DC home Comfort measures reviewed  3rd Trimester precautions  PTL precautions  Fetal kick counts RX: labetalol 200mg  BID #30  Return to MAU as needed FU with OB as planned  Follow-up Information    [facility] Follow up.    Specialty:  Obstetrics and Gynecology Why:  Return tomorrow Thursday May 7 in the evening for second steroid shot Contact information: [address] [id number] [place]c [place] [id number] [phone]       [facility], [facility].   Specialty:  Obstetrics and Gynecology Why:  Friday morning to drop off your 24 hour urine collection  Contact information: [address] [place] [id number] [phone]            Thressa ShellerHeather Venezia Sargeant DNP, CNM  05/07/18  9:21 PM

## 2018-05-07 NOTE — MAU Note (Signed)
Pt states that she started having ctx's 3 hours ago. Pt states that 1 hour ago the ctx's started to be painful.   Reports +FM   Denies vaginal bleeding or LOF.

## 2018-05-07 NOTE — Discharge Instructions (Signed)
24-Hour Urine Collection  Introduction  A 24-hour urine specimen is a lab test that requires collection of all your urine for an entire day. This is sometimes called a timed urine test. It can provide more information than a single urine sample.  There are many reasons to have this test. Your health care provider may order the test to check for or monitor the following conditions:   High blood pressure.   Kidney disease.   Kidney stones.   Urinary tract infections.   Pregnancy.   Diabetes.  How do I prepare for this test?   You may be asked to follow a special diet during or before the collection period. Follow any instructions from your health care provider. If no special instructions are given, you may eat and drink normally.   Take over-the-counter and prescription medicines only as told by your health care provider.   Let your health care provider know about any medicines that you are taking, including over-the-counter medicines, vitamins, herbs, and supplements.   Choose a collection day when you can be at home or when you have a place to store the urine. All urine must be collected during the testing period.  How do I do a 24-hour urine collection?     When you get up in the morning, urinate in the toilet and flush. Write down the time. This will be your start time on the day of collection and your end time on the next morning.   From the start time on, all of your urine should be kept in the collection jug that you received from the lab.   If the jug that is given to you already has liquid in it, that is okay. Do not throw out the liquid or rinse out the jug. Some tests need the liquid to be added to your urine.   Urinate into a specimen container, such as a urinal or pan that sits over the toilet. Pour the urine from the container into the collection jug. Be careful not to spill any of the urine. Use the equipment provided by the lab.   Do not let any toilet paper or stool (feces) get into the  jug. This will contaminate the sample.   Stop collecting your urine 24 hours after you started. Collect the last specimen as close as possible to the end of the 24-hour period.   Keep the jug cool in an ice chest or keep it in the refrigerator during collection.   When the 24-hour collection is complete, bring the jug to the lab. Keep the jug cool in an ice chest while you are bringing it to the lab.  What do the results mean?  Talk with your health care provider about what your results mean.  Questions to ask your health care provider  Ask your health care provider, or the department that is doing the test:   When will my results be ready?   How will I get my results?   What are my treatment options?   What other tests do I need?   What are my next steps?  Summary   A 24-hour urine specimen is a lab test that requires collection of all your urine for an entire day.   When you get up in the morning, urinate in the toilet and flush. Write down the time. For the next 24 hours, collect all of your urine in the collection jug that you received from the lab.   Keep   the jug cool while collecting the urine and while bringing it back to the lab.   Take the jug of urine back to the lab as soon as possible after the collection period has ended.  This information is not intended to replace advice given to you by your health care provider. Make sure you discuss any questions you have with your health care provider.  Document Released: 03/16/2008 Document Revised: 12/31/2016 Document Reviewed: 12/31/2016  Elsevier Interactive Patient Education  2019 [employer]vier Inc[employer]

## 2018-05-08 ENCOUNTER — Other Ambulatory Visit: Payer: Self-pay

## 2018-05-08 ENCOUNTER — Inpatient Hospital Stay (HOSPITAL_COMMUNITY)
Admission: AD | Admit: 2018-05-08 | Discharge: 2018-05-08 | Disposition: A | Discharge status: Home / Self Care | Payer: Managed Care, Other (non HMO) | Attending: Obstetrics & Gynecology | Admitting: Obstetrics & Gynecology

## 2018-05-08 DIAGNOSIS — O4703 False labor before 37 completed weeks of gestation, third trimester: Secondary | ICD-10-CM | POA: Diagnosis not present

## 2018-05-08 DIAGNOSIS — F1121 Opioid dependence, in remission: Secondary | ICD-10-CM

## 2018-05-08 DIAGNOSIS — Z3A35 35 weeks gestation of pregnancy: Secondary | ICD-10-CM | POA: Diagnosis not present

## 2018-05-08 MED ORDER — BETAMETHASONE SOD PHOS & ACET 6 (3-3) MG/ML IJ SUSP
12.0000 mg | Freq: Once | INTRAMUSCULAR | Status: AC
  Administered 2018-05-08: 21:00:00 12 mg via INTRAMUSCULAR
  Filled 2018-05-08: qty 2

## 2018-05-08 NOTE — MAU Note (Signed)
Pt here for follow up. Received betamethasone injection last night and is here for 2nd dose. Has had some contractions throughout the day but not uncomfortable like last night. Denies any bleeding or leaking. Reports good fetal movement.

## 2018-05-08 NOTE — Progress Notes (Signed)
Orders to give 2nd dose of Betamethasone, recheck BP. If normal, D/C home. Will follow up in the office as scheduled on Tuesday.

## 2018-05-10 ENCOUNTER — Inpatient Hospital Stay (HOSPITAL_COMMUNITY)
Admission: AD | Admit: 2018-05-10 | Discharge: 2018-05-10 | Disposition: A | Discharge status: Home / Self Care | Payer: Managed Care, Other (non HMO) | Attending: Obstetrics and Gynecology | Admitting: Obstetrics and Gynecology

## 2018-05-10 ENCOUNTER — Other Ambulatory Visit: Payer: Self-pay

## 2018-05-10 DIAGNOSIS — Z3A35 35 weeks gestation of pregnancy: Secondary | ICD-10-CM | POA: Insufficient documentation

## 2018-05-10 DIAGNOSIS — O479 False labor, unspecified: Secondary | ICD-10-CM | POA: Diagnosis not present

## 2018-05-10 DIAGNOSIS — O10013 Pre-existing essential hypertension complicating pregnancy, third trimester: Secondary | ICD-10-CM | POA: Diagnosis not present

## 2018-05-10 DIAGNOSIS — F1121 Opioid dependence, in remission: Secondary | ICD-10-CM

## 2018-05-10 LAB — URINALYSIS, ROUTINE W REFLEX MICROSCOPIC
Bilirubin Urine: NEGATIVE
Glucose, UA: NEGATIVE mg/dL
Ketones, ur: NEGATIVE mg/dL
Leukocytes,Ua: NEGATIVE
Nitrite: NEGATIVE
Protein, ur: NEGATIVE mg/dL
Specific Gravity, Urine: 1.004 — ABNORMAL LOW (ref 1.005–1.030)
pH: 7 (ref 5.0–8.0)

## 2018-05-10 MED ORDER — LACTATED RINGERS IV BOLUS
1000.0000 mL | Freq: Once | INTRAVENOUS | Status: AC
  Administered 2018-05-10: 22:00:00 1000 mL via INTRAVENOUS

## 2018-05-10 MED ORDER — LABETALOL HCL 100 MG PO TABS
200.0000 mg | ORAL_TABLET | Freq: Once | ORAL | Status: AC
  Administered 2018-05-10: 20:00:00 200 mg via ORAL
  Filled 2018-05-10: qty 2

## 2018-05-10 MED ORDER — CYCLOBENZAPRINE HCL 10 MG PO TABS: 10.0000 mg | ORAL_TABLET | Freq: Two times a day (BID) | ORAL | 0 refills | Status: AC | PRN

## 2018-05-10 MED ORDER — CYCLOBENZAPRINE HCL 10 MG PO TABS
10.0000 mg | ORAL_TABLET | Freq: Once | ORAL | Status: AC
  Administered 2018-05-10: 10 mg via ORAL
  Filled 2018-05-10: qty 1

## 2018-05-10 NOTE — Discharge Instructions (Signed)
Braxton Hicks Contractions Contractions of the uterus can occur throughout pregnancy, but they are not always a sign that you are in labor. You may have practice contractions called Braxton Hicks contractions. These false labor contractions are sometimes confused with true labor. What are Braxton Hicks contractions? Braxton Hicks contractions are tightening movements that occur in the muscles of the uterus before labor. Unlike true labor contractions, these contractions do not result in opening (dilation) and thinning of the cervix. Toward the end of pregnancy (32-34 weeks), Braxton Hicks contractions can happen more often and may become stronger. These contractions are sometimes difficult to tell apart from true labor because they can be very uncomfortable. You should not feel embarrassed if you go to the hospital with false labor. Sometimes, the only way to tell if you are in true labor is for your health care provider to look for changes in the cervix. The health care provider will do a physical exam and may monitor your contractions. If you are not in true labor, the exam should show that your cervix is not dilating and your water has not broken. If there are no other health problems associated with your pregnancy, it is completely safe for you to be sent home with false labor. You may continue to have Braxton Hicks contractions until you go into true labor. How to tell the difference between true labor and false labor True labor  Contractions last 30-70 seconds.  Contractions become very regular.  Discomfort is usually felt in the top of the uterus, and it spreads to the lower abdomen and low back.  Contractions do not go away with walking.  Contractions usually become more intense and increase in frequency.  The cervix dilates and gets thinner. False labor  Contractions are usually shorter and not as strong as true labor contractions.  Contractions are usually irregular.  Contractions  are often felt in the front of the lower abdomen and in the groin.  Contractions may go away when you walk around or change positions while lying down.  Contractions get weaker and are shorter-lasting as time goes on.  The cervix usually does not dilate or become thin. Follow these instructions at home:   Take over-the-counter and prescription medicines only as told by your health care provider.  Keep up with your usual exercises and follow other instructions from your health care provider.  Eat and drink lightly if you think you are going into labor.  If Braxton Hicks contractions are making you uncomfortable: ? Change your position from lying down or resting to walking, or change from walking to resting. ? Sit and rest in a tub of warm water. ? Drink enough fluid to keep your urine pale yellow. Dehydration may cause these contractions. ? Do slow and deep breathing several times an hour.  Keep all follow-up prenatal visits as told by your health care provider. This is important. Contact a health care provider if:  You have a fever.  You have continuous pain in your abdomen. Get help right away if:  Your contractions become stronger, more regular, and closer together.  You have fluid leaking or gushing from your vagina.  You pass blood-tinged mucus (bloody show).  You have bleeding from your vagina.  You have low back pain that you never had before.  You feel your baby's head pushing down and causing pelvic pressure.  Your baby is not moving inside you as much as it used to. Summary  Contractions that occur before labor are   called Braxton Hicks contractions, false labor, or practice contractions.  Braxton Hicks contractions are usually shorter, weaker, farther apart, and less regular than true labor contractions. True labor contractions usually become progressively stronger and regular, and they become more frequent.  Manage discomfort from Braxton Hicks contractions  by changing position, resting in a warm bath, drinking plenty of water, or practicing deep breathing. This information is not intended to replace advice given to you by your health care provider. Make sure you discuss any questions you have with your health care provider. Document Released: 05/03/2016 Document Revised: 10/02/2016 Document Reviewed: 05/03/2016 Elsevier Interactive Patient Education  2019 Elsevier Inc.  

## 2018-05-10 NOTE — Progress Notes (Signed)
Thalia Bloodgood CNM in earlier to discuss d/c plan with pt. WRitten and verbal d/c instructions given and understanding voiced

## 2018-05-10 NOTE — MAU Provider Note (Signed)
History     CSN: [id number]  Arrival date and time: 05/10/18 6962   First Provider Initiated Contact with Patient 05/10/18 1947      Chief Complaint  Patient presents with  . Contractions   HPI Penny Morrison is a 38 y.o. G2P1001 at [redacted]w[redacted]d who presents to MAU with chief complaint of preterm contractions. This is a recurrent problem for which she has previously been triaged in MAU. She reports that her current episode began at 0800 today. She reports abdominal contraction pain 7-8/10 throughout the day today without increasing intensity.  Patient's pregnancy complications include Chronic Hypertension. She states her daily Labetalol was increased to  TID yesterday. She has not taken her third dose for the day today. She denies severe signs or symptoms.  Patient receives prenatal [facility]. She denies vaginal bleeding, leaking of fluid, decreased fetal movement, fever, falls, or recent illness.    OB History    Gravida  2   Para  1   Term  1   Preterm      AB      Living  1     SAB      TAB      Ectopic      Multiple      Live Births  1           Past Medical History:  Diagnosis Date  . Gestational diabetes   . Hypertension   . Leukocytopenia, unspecified 11/10/2012  . Leukopenia   . Pregnancy induced hypertension   . Tachycardia   . Vertigo     Past Surgical History:  Procedure Laterality Date  . NO PAST SURGERIES      Family History  Problem Relation Age of Onset  . Cancer Maternal Grandmother        colon ca  . Hypertension Maternal Grandmother   . Healthy Mother   . Healthy Father     Social History   Tobacco Use  . Smoking status: Never Smoker  . Smokeless tobacco: Never Used  Substance Use Topics  . Alcohol use: No  . Drug use: No    Allergies: No Known Allergies  Medications Prior to Admission  Medication Sig Dispense Refill Last Dose  . cholecalciferol (VITAMIN D3) 25 MCG (1000 UT) tablet Take 1,000 Units by  mouth daily.   05/10/2018 at Unknown time  . diphenhydramine-acetaminophen (TYLENOL PM) 25-500 MG TABS tablet Take 2 tablets by mouth at bedtime as needed.   05/09/2018 at Unknown time  . GABAPENTIN PO Take by mouth.   05/10/2018 at Unknown time  . hydrOXYzine (VISTARIL) 25 MG capsule Take 25 mg by mouth 3 (three) times daily as needed.   Past Week at Unknown time  . labetalol (NORMODYNE) 200 MG tablet Take 0.5 tablets (100 mg total) by mouth 2 (two) times daily. (Patient taking differently: Take 100 mg by mouth 3 (three) times daily. ) 30 tablet 0 05/10/2018 at Unknown time  . Prenatal MV-Min-Fe Fum-FA-DHA (PRENATAL+DHA PO) Take 1 each by mouth daily.   05/10/2018 at Unknown time  . acebutolol (SECTRAL) 200 MG capsule Take 200 mg by mouth at bedtime.   05/06/2018 at Unknown time  . Ferrous Sulfate (IRON PO) Take by mouth.   Past Week at Unknown time  . Vitamin D, Ergocalciferol, (DRISDOL) 50000 units CAPS capsule Take 50,000 Units by mouth every 7 (seven) days. On Sundays   05/06/2018 at Unknown time  . zolpidem (AMBIEN) 5 MG tablet Take  1 tablet (5 mg total) by mouth at bedtime as needed for sleep. (Patient not taking: Reported on 03/12/2018) 5 tablet 0 Not Taking    Review of Systems  Constitutional: Negative for chills, fatigue and fever.  Respiratory: Negative for shortness of breath.   Gastrointestinal: Positive for abdominal pain.  Genitourinary: Negative for difficulty urinating, dysuria, flank pain, vaginal bleeding, vaginal discharge and vaginal pain.  Musculoskeletal: Negative for back pain.  Neurological: Negative for headaches.  All other systems reviewed and are negative.  Physical Exam   Blood pressure (!) 152/87, pulse 83, temperature 98.3 F (36.8 C), resp. rate 18, height 5\' 6"  (1.676 m), weight 85.3 kg, last menstrual period 09/06/2017.  Physical Exam  Nursing note and vitals reviewed. Constitutional: She is oriented to person, place, and time. She appears well-developed and  well-nourished.  Cardiovascular: Normal rate.  Respiratory: Effort normal.  GI: She exhibits no distension. There is no abdominal tenderness. There is no rebound and no guarding.  Gravid  Neurological: She is alert and oriented to person, place, and time.  Skin: Skin is warm and dry.  Psychiatric: She has a normal mood and affect. Her behavior is normal. Judgment and thought content normal.    MAU Course  Procedures  --Baseline 135, moderate variability, positive accels, no decels (multiple episodes of tracing maternal) --Toco: irregular contractions q 2-8 minutes --Fetal tracing consistent with tracing from 05/07/18. Per MFM BPP not indicated if strip is reactive during prolonged monitoring --Labetalol 200 mg administered at 2028 in MAU --Cervix remains closed s/p 3.5 hours of monitoring in MAU --Discomfort resolving with IV fluid bolus and Flexeril  Patient Vitals for the past 24 hrs:  BP Temp Pulse Resp Height Weight  05/10/18 2253 140/67 - 75 18 - -  05/10/18 2146 136/73 - 82 - - -  05/10/18 2130 125/63 - 73 - - -  05/10/18 2116 122/66 - 78 - - -  05/10/18 2101 (!) 143/82 - 73 - - -  05/10/18 2046 140/83 - 73 - - -  05/10/18 2031 132/68 - 79 - - -  05/10/18 2016 (!) 148/90 - 83 - - -  05/10/18 2001 (!) 153/83 - 80 - - -  05/10/18 1946 (!) 152/87 - 83 - - -  05/10/18 1931 (!) 156/87 - 84 - - -  05/10/18 1915 (!) 150/80 - 86 - - -  05/10/18 1910 - 98.3 F (36.8 C) - 18 5\' 6"  (1.676 m) 85.3 kg   Results for orders placed or performed during the hospital encounter of 05/10/18 (from the past 24 hour(s))  Urinalysis, Routine w reflex microscopic     Status: Abnormal   Collection Time: 05/10/18  7:40 PM  Result Value Ref Range   Color, Urine STRAW (A) YELLOW   APPearance HAZY (A) CLEAR   Specific Gravity, Urine 1.004 (L) 1.005 - 1.030   pH 7.0 5.0 - 8.0   Glucose, UA NEGATIVE NEGATIVE mg/dL   Hgb urine dipstick SMALL (A) NEGATIVE   Bilirubin Urine NEGATIVE NEGATIVE    Ketones, ur NEGATIVE NEGATIVE mg/dL   Protein, ur NEGATIVE NEGATIVE mg/dL   Nitrite NEGATIVE NEGATIVE   Leukocytes,Ua NEGATIVE NEGATIVE   RBC / HPF 0-5 0 - 5 RBC/hpf   WBC, UA 0-5 0 - 5 WBC/hpf   Bacteria, UA MANY (A) NONE SEEN   Squamous Epithelial / LPF 6-10 0 - 5     Meds ordered this encounter  Medications  . labetalol (NORMODYNE) tablet 200 mg  .  lactated ringers bolus 1,000 mL  . cyclobenzaprine (FLEXERIL) tablet 10 mg  . cyclobenzaprine (FLEXERIL) 10 MG tablet    Sig: Take 1 tablet (10 mg total) by mouth 2 (two) times daily as needed for muscle spasms.    Dispense:  20 tablet    Refill:  0    Order Specific Question:   Supervising Provider    Answer:   Reva BoresPRATT, TANYA S [[id number]]   Assessment and Plan  --38 y.o. G2P1001 at 3158w2d  --Reactive tracing --S/p 3 hours monitoring, cervix remains closed --Discharge home in stable condition  Calvert CantorSamantha C Weinhold, CNM 05/11/2018, 12:47 AM

## 2018-05-10 NOTE — MAU Note (Addendum)
Was seen MAU 5/6 for ctxs and cervix closed. Ctxs decreased some but have continued off and on since then. Ctxs stronger yesterday and saw some bloody mucous in panties then but none since. Was able to sleep and when woke up yest felt better. Ctxs back today and somewhat strong. No bloody show. Ctx more pressure in lower abd than pain. Chronic HTN On Labetalol 200mg  TID. HAs had 2 doses today.

## 2018-05-10 NOTE — Progress Notes (Signed)
Baby active 

## 2018-05-13 ENCOUNTER — Ambulatory Visit (HOSPITAL_COMMUNITY)
Admission: RE | Admit: 2018-05-13 | Discharge: 2018-05-13 | Disposition: A | Discharge status: Home / Self Care | Payer: Managed Care, Other (non HMO) | Source: Ambulatory Visit | Attending: Obstetrics and Gynecology | Admitting: Obstetrics and Gynecology

## 2018-05-13 ENCOUNTER — Encounter (HOSPITAL_COMMUNITY): Payer: Self-pay

## 2018-05-13 ENCOUNTER — Ambulatory Visit (HOSPITAL_COMMUNITY): Payer: Managed Care, Other (non HMO) | Admitting: *Deleted

## 2018-05-13 ENCOUNTER — Other Ambulatory Visit (HOSPITAL_COMMUNITY): Payer: Self-pay | Admitting: Obstetrics and Gynecology

## 2018-05-13 ENCOUNTER — Other Ambulatory Visit: Payer: Self-pay

## 2018-05-13 VITALS — BP 143/89 | HR 88 | Temp 98.1°F

## 2018-05-13 DIAGNOSIS — O09293 Supervision of pregnancy with other poor reproductive or obstetric history, third trimester: Secondary | ICD-10-CM | POA: Diagnosis not present

## 2018-05-13 DIAGNOSIS — O09523 Supervision of elderly multigravida, third trimester: Secondary | ICD-10-CM

## 2018-05-13 DIAGNOSIS — O289 Unspecified abnormal findings on antenatal screening of mother: Secondary | ICD-10-CM

## 2018-05-13 DIAGNOSIS — Z8279 Family history of other congenital malformations, deformations and chromosomal abnormalities: Secondary | ICD-10-CM

## 2018-05-13 DIAGNOSIS — O10919 Unspecified pre-existing hypertension complicating pregnancy, unspecified trimester: Secondary | ICD-10-CM | POA: Insufficient documentation

## 2018-05-13 DIAGNOSIS — O10013 Pre-existing essential hypertension complicating pregnancy, third trimester: Secondary | ICD-10-CM

## 2018-05-13 DIAGNOSIS — O351XX0 Maternal care for (suspected) chromosomal abnormality in fetus, not applicable or unspecified: Secondary | ICD-10-CM

## 2018-05-13 DIAGNOSIS — Z3A35 35 weeks gestation of pregnancy: Secondary | ICD-10-CM

## 2018-05-14 ENCOUNTER — Telehealth (HOSPITAL_COMMUNITY): Payer: Self-pay | Admitting: *Deleted

## 2018-05-14 ENCOUNTER — Encounter (HOSPITAL_COMMUNITY): Payer: Self-pay | Admitting: *Deleted

## 2018-05-14 NOTE — Telephone Encounter (Signed)
Preadmission screen  

## 2018-05-18 ENCOUNTER — Other Ambulatory Visit: Payer: Self-pay | Admitting: Obstetrics and Gynecology

## 2018-05-21 ENCOUNTER — Other Ambulatory Visit (HOSPITAL_COMMUNITY)
Admission: RE | Admit: 2018-05-21 | Discharge: 2018-05-21 | Disposition: A | Discharge status: Home / Self Care | Payer: Managed Care, Other (non HMO) | Source: Ambulatory Visit | Attending: Obstetrics and Gynecology | Admitting: Obstetrics and Gynecology

## 2018-05-21 ENCOUNTER — Other Ambulatory Visit: Payer: Self-pay

## 2018-05-21 DIAGNOSIS — Z1159 Encounter for screening for other viral diseases: Secondary | ICD-10-CM | POA: Diagnosis present

## 2018-05-21 NOTE — MAU Note (Signed)
Covid swab  Complete. Pt tolerated well Pt asymptomatic

## 2018-05-22 ENCOUNTER — Other Ambulatory Visit (HOSPITAL_COMMUNITY): Payer: Self-pay | Admitting: *Deleted

## 2018-05-22 LAB — NOVEL CORONAVIRUS, NAA (HOSP ORDER, SEND-OUT TO REF LAB; TAT 18-24 HRS): SARS-CoV-2, NAA: NOT DETECTED

## 2018-05-23 ENCOUNTER — Inpatient Hospital Stay (HOSPITAL_COMMUNITY): Payer: Managed Care, Other (non HMO) | Admitting: Anesthesiology

## 2018-05-23 ENCOUNTER — Encounter (HOSPITAL_COMMUNITY): Payer: Self-pay

## 2018-05-23 ENCOUNTER — Inpatient Hospital Stay (HOSPITAL_COMMUNITY): Payer: Managed Care, Other (non HMO)

## 2018-05-23 ENCOUNTER — Other Ambulatory Visit: Payer: Self-pay

## 2018-05-23 ENCOUNTER — Inpatient Hospital Stay (HOSPITAL_COMMUNITY)
Admission: AD | Admit: 2018-05-23 | Discharge: 2018-05-27 | DRG: 807 | Disposition: A | Discharge status: Home / Self Care | Payer: Managed Care, Other (non HMO) | Attending: Obstetrics and Gynecology | Admitting: Obstetrics and Gynecology

## 2018-05-23 DIAGNOSIS — O99284 Endocrine, nutritional and metabolic diseases complicating childbirth: Secondary | ICD-10-CM | POA: Diagnosis present

## 2018-05-23 DIAGNOSIS — O1002 Pre-existing essential hypertension complicating childbirth: Secondary | ICD-10-CM | POA: Diagnosis present

## 2018-05-23 DIAGNOSIS — O119 Pre-existing hypertension with pre-eclampsia, unspecified trimester: Secondary | ICD-10-CM | POA: Diagnosis present

## 2018-05-23 DIAGNOSIS — O351XX0 Maternal care for (suspected) chromosomal abnormality in fetus, not applicable or unspecified: Secondary | ICD-10-CM | POA: Diagnosis present

## 2018-05-23 DIAGNOSIS — O114 Pre-existing hypertension with pre-eclampsia, complicating childbirth: Principal | ICD-10-CM | POA: Diagnosis present

## 2018-05-23 DIAGNOSIS — E876 Hypokalemia: Secondary | ICD-10-CM | POA: Diagnosis present

## 2018-05-23 DIAGNOSIS — Z3A37 37 weeks gestation of pregnancy: Secondary | ICD-10-CM | POA: Diagnosis not present

## 2018-05-23 LAB — COMPREHENSIVE METABOLIC PANEL
ALT: 10 U/L (ref 0–44)
AST: 16 U/L (ref 15–41)
Albumin: 2.6 g/dL — ABNORMAL LOW (ref 3.5–5.0)
Alkaline Phosphatase: 123 U/L (ref 38–126)
Anion gap: 13 (ref 5–15)
BUN: 5 mg/dL — ABNORMAL LOW (ref 6–20)
CO2: 21 mmol/L — ABNORMAL LOW (ref 22–32)
Calcium: 8.9 mg/dL (ref 8.9–10.3)
Chloride: 101 mmol/L (ref 98–111)
Creatinine, Ser: 0.73 mg/dL (ref 0.44–1.00)
GFR calc Af Amer: >60 mL/min (ref >60)
GFR calc non Af Amer: >60 mL/min (ref >60)
Glucose, Bld: 116 mg/dL — ABNORMAL HIGH (ref 70–99)
Potassium: 2.4 mmol/L — CL (ref 3.5–5.1)
Sodium: 135 mmol/L (ref 135–145)
Total Bilirubin: 0.5 mg/dL (ref 0.3–1.2)
Total Protein: 6.9 g/dL (ref 6.5–8.1)

## 2018-05-23 LAB — CBC
HCT: 31.3 % — ABNORMAL LOW (ref 36.0–46.0)
HCT: 31.7 % — ABNORMAL LOW (ref 36.0–46.0)
HCT: 31.8 % — ABNORMAL LOW (ref 36.0–46.0)
Hemoglobin: 10.7 g/dL — ABNORMAL LOW (ref 12.0–15.0)
Hemoglobin: 10.5 g/dL — ABNORMAL LOW (ref 12.0–15.0)
Hemoglobin: 10.5 g/dL — ABNORMAL LOW (ref 12.0–15.0)
MCH: 31.4 pg (ref 26.0–34.0)
MCH: 30.9 pg (ref 26.0–34.0)
MCH: 31.2 pg (ref 26.0–34.0)
MCHC: 34.2 g/dL (ref 30.0–36.0)
MCHC: 33.1 g/dL (ref 30.0–36.0)
MCHC: 33 g/dL (ref 30.0–36.0)
MCV: 91.8 fL (ref 80.0–100.0)
MCV: 93.2 fL (ref 80.0–100.0)
MCV: 94.4 fL (ref 80.0–100.0)
Platelets: 226 10*3/uL (ref 150–400)
Platelets: 221 10*3/uL (ref 150–400)
Platelets: 205 10*3/uL (ref 150–400)
RBC: 3.41 MIL/uL — ABNORMAL LOW (ref 3.87–5.11)
RBC: 3.4 MIL/uL — ABNORMAL LOW (ref 3.87–5.11)
RBC: 3.37 MIL/uL — ABNORMAL LOW (ref 3.87–5.11)
RDW: 16.5 % — ABNORMAL HIGH (ref 11.5–15.5)
RDW: 16.4 % — ABNORMAL HIGH (ref 11.5–15.5)
RDW: 16.6 % — ABNORMAL HIGH (ref 11.5–15.5)
WBC: 7.4 10*3/uL (ref 4.0–10.5)
WBC: 7.1 10*3/uL (ref 4.0–10.5)
WBC: 8.5 10*3/uL (ref 4.0–10.5)
nRBC: 0.3 % — ABNORMAL HIGH (ref 0.0–0.2)
nRBC: 0 % (ref 0.0–0.2)
nRBC: 0 % (ref 0.0–0.2)

## 2018-05-23 LAB — TYPE AND SCREEN
ABO/RH(D): O POS
Antibody Screen: NEGATIVE

## 2018-05-23 LAB — ABO/RH: ABO/RH(D): O POS

## 2018-05-23 LAB — POTASSIUM
Potassium: 2.6 mmol/L — CL (ref 3.5–5.1)
Potassium: 2.6 mmol/L — CL (ref 3.5–5.1)

## 2018-05-23 LAB — URIC ACID: Uric Acid, Serum: 4 mg/dL (ref 2.5–7.1)

## 2018-05-23 LAB — RPR: RPR Ser Ql: NONREACTIVE

## 2018-05-23 LAB — LACTATE DEHYDROGENASE: LDH: 144 U/L (ref 98–192)

## 2018-05-23 MED ORDER — BENZOCAINE-MENTHOL 20-0.5 % EX AERO
1.0000 "application " | INHALATION_SPRAY | CUTANEOUS | Status: DC | PRN
  Administered 2018-05-23: 1 via TOPICAL
  Filled 2018-05-23 (×2): qty 56

## 2018-05-23 MED ORDER — LACTATED RINGERS IV SOLN
500.0000 mL | INTRAVENOUS | Status: DC | PRN
  Administered 2018-05-23: 10:00:00 500 mL via INTRAVENOUS

## 2018-05-23 MED ORDER — OXYCODONE-ACETAMINOPHEN 5-325 MG PO TABS: 2.0000 | ORAL_TABLET | ORAL | Status: DC | PRN

## 2018-05-23 MED ORDER — LABETALOL HCL 5 MG/ML IV SOLN: 40.0000 mg | INTRAVENOUS | Status: DC | PRN

## 2018-05-23 MED ORDER — FENTANYL CITRATE (PF) 100 MCG/2ML IJ SOLN: 50.0000 ug | INTRAMUSCULAR | Status: DC | PRN

## 2018-05-23 MED ORDER — SIMETHICONE 80 MG PO CHEW: 80.0000 mg | CHEWABLE_TABLET | ORAL | Status: DC | PRN

## 2018-05-23 MED ORDER — ACETAMINOPHEN 325 MG PO TABS: 650.0000 mg | ORAL_TABLET | ORAL | Status: DC | PRN

## 2018-05-23 MED ORDER — ONDANSETRON HCL 4 MG PO TABS: 4.0000 mg | ORAL_TABLET | ORAL | Status: DC | PRN

## 2018-05-23 MED ORDER — POTASSIUM CHLORIDE 10 MEQ/100ML IV SOLN
10.0000 meq | INTRAVENOUS | Status: AC
  Administered 2018-05-23 – 2018-05-24 (×3): 10 meq via INTRAVENOUS
  Filled 2018-05-23 (×3): qty 100

## 2018-05-23 MED ORDER — POTASSIUM CHLORIDE 10 MEQ/100ML IV SOLN
10.0000 meq | INTRAVENOUS | Status: AC
  Administered 2018-05-23 (×3): 10 meq via INTRAVENOUS
  Filled 2018-05-23 (×3): qty 100

## 2018-05-23 MED ORDER — LACTATED RINGERS IV SOLN
500.0000 mL | Freq: Once | INTRAVENOUS | Status: AC
  Administered 2018-05-23: 14:00:00 500 mL via INTRAVENOUS

## 2018-05-23 MED ORDER — DIPHENHYDRAMINE HCL 25 MG PO CAPS
25.0000 mg | ORAL_CAPSULE | Freq: Four times a day (QID) | ORAL | Status: DC | PRN
  Administered 2018-05-24: 04:00:00 25 mg via ORAL
  Filled 2018-05-23: qty 1

## 2018-05-23 MED ORDER — OXYCODONE-ACETAMINOPHEN 5-325 MG PO TABS: 1.0000 | ORAL_TABLET | ORAL | Status: DC | PRN

## 2018-05-23 MED ORDER — LACTATED RINGERS IV SOLN
INTRAVENOUS | Status: DC
  Administered 2018-05-23 (×3): via INTRAVENOUS

## 2018-05-23 MED ORDER — LABETALOL HCL 5 MG/ML IV SOLN: 80.0000 mg | INTRAVENOUS | Status: DC | PRN

## 2018-05-23 MED ORDER — PRENATAL MULTIVITAMIN CH
1.0000 | ORAL_TABLET | Freq: Every day | ORAL | Status: DC
  Administered 2018-05-24 – 2018-05-26 (×3): 1 via ORAL
  Filled 2018-05-23 (×3): qty 1

## 2018-05-23 MED ORDER — HYDRALAZINE HCL 20 MG/ML IJ SOLN: 10.0000 mg | INTRAMUSCULAR | Status: DC | PRN

## 2018-05-23 MED ORDER — POTASSIUM CHLORIDE IN NACL 20-0.9 MEQ/L-% IV SOLN
INTRAVENOUS | Status: DC
  Administered 2018-05-23: 16:00:00 via INTRAVENOUS
  Filled 2018-05-23: qty 1000

## 2018-05-23 MED ORDER — TETANUS-DIPHTH-ACELL PERTUSSIS 5-2.5-18.5 LF-MCG/0.5 IM SUSP: 0.5000 mL | Freq: Once | INTRAMUSCULAR | Status: DC

## 2018-05-23 MED ORDER — COCONUT OIL OIL: 1.0000 "application " | TOPICAL_OIL | Status: DC | PRN

## 2018-05-23 MED ORDER — LIDOCAINE HCL (PF) 1 % IJ SOLN: 30.0000 mL | INTRAMUSCULAR | Status: DC | PRN

## 2018-05-23 MED ORDER — ZOLPIDEM TARTRATE 5 MG PO TABS: 5.0000 mg | ORAL_TABLET | Freq: Every evening | ORAL | Status: DC | PRN

## 2018-05-23 MED ORDER — LACTATED RINGERS IV SOLN
INTRAVENOUS | Status: DC
  Administered 2018-05-23: 23:00:00 via INTRAVENOUS

## 2018-05-23 MED ORDER — OXYTOCIN BOLUS FROM INFUSION
500.0000 mL | Freq: Once | INTRAVENOUS | Status: AC
  Administered 2018-05-23: 18:00:00 500 mL via INTRAVENOUS

## 2018-05-23 MED ORDER — MISOPROSTOL 25 MCG QUARTER TABLET
25.0000 ug | ORAL_TABLET | ORAL | Status: DC | PRN
  Administered 2018-05-23: 08:00:00 25 ug via VAGINAL
  Filled 2018-05-23: qty 1

## 2018-05-23 MED ORDER — PHENYLEPHRINE 40 MCG/ML (10ML) SYRINGE FOR IV PUSH (FOR BLOOD PRESSURE SUPPORT): 80.0000 ug | PREFILLED_SYRINGE | INTRAVENOUS | Status: DC | PRN

## 2018-05-23 MED ORDER — LIDOCAINE HCL (PF) 1 % IJ SOLN
INTRAMUSCULAR | Status: DC | PRN
  Administered 2018-05-23 (×2): 5 mL via EPIDURAL

## 2018-05-23 MED ORDER — SOD CITRATE-CITRIC ACID 500-334 MG/5ML PO SOLN: 30.0000 mL | ORAL | Status: DC | PRN

## 2018-05-23 MED ORDER — SODIUM CHLORIDE (PF) 0.9 % IJ SOLN
INTRAMUSCULAR | Status: DC | PRN
  Administered 2018-05-23: 14 mL/h via EPIDURAL

## 2018-05-23 MED ORDER — WITCH HAZEL-GLYCERIN EX PADS: 1.0000 "application " | MEDICATED_PAD | CUTANEOUS | Status: DC | PRN

## 2018-05-23 MED ORDER — FENTANYL-BUPIVACAINE-NACL 0.5-0.125-0.9 MG/250ML-% EP SOLN: 12.0000 mL/h | EPIDURAL | Status: DC | PRN

## 2018-05-23 MED ORDER — DIBUCAINE (PERIANAL) 1 % EX OINT: 1.0000 "application " | TOPICAL_OINTMENT | CUTANEOUS | Status: DC | PRN

## 2018-05-23 MED ORDER — OXYTOCIN 40 UNITS IN NORMAL SALINE INFUSION - SIMPLE MED
2.5000 [IU]/h | INTRAVENOUS | Status: DC
  Filled 2018-05-23: qty 1000

## 2018-05-23 MED ORDER — MAGNESIUM SULFATE 40 G IN LACTATED RINGERS - SIMPLE
2.0000 g/h | INTRAVENOUS | Status: AC
  Filled 2018-05-23: qty 500

## 2018-05-23 MED ORDER — EPHEDRINE 5 MG/ML INJ: 10.0000 mg | INTRAVENOUS | Status: DC | PRN

## 2018-05-23 MED ORDER — FENTANYL-BUPIVACAINE-NACL 0.5-0.125-0.9 MG/250ML-% EP SOLN
12.0000 mL/h | EPIDURAL | Status: DC | PRN
  Filled 2018-05-23: qty 250

## 2018-05-23 MED ORDER — ACETAMINOPHEN 325 MG PO TABS
650.0000 mg | ORAL_TABLET | ORAL | Status: DC | PRN
  Administered 2018-05-23 – 2018-05-26 (×2): 650 mg via ORAL
  Filled 2018-05-23 (×2): qty 2

## 2018-05-23 MED ORDER — DIPHENHYDRAMINE HCL 50 MG/ML IJ SOLN: 12.5000 mg | INTRAMUSCULAR | Status: DC | PRN

## 2018-05-23 MED ORDER — MAGNESIUM SULFATE BOLUS VIA INFUSION
4.0000 g | Freq: Once | INTRAVENOUS | Status: DC
  Filled 2018-05-23: qty 500

## 2018-05-23 MED ORDER — LABETALOL HCL 200 MG PO TABS
200.0000 mg | ORAL_TABLET | Freq: Three times a day (TID) | ORAL | Status: DC
  Administered 2018-05-23 (×2): 200 mg via ORAL
  Filled 2018-05-23 (×2): qty 1

## 2018-05-23 MED ORDER — LABETALOL HCL 5 MG/ML IV SOLN: 20.0000 mg | INTRAVENOUS | Status: DC | PRN

## 2018-05-23 MED ORDER — IBUPROFEN 600 MG PO TABS
600.0000 mg | ORAL_TABLET | Freq: Four times a day (QID) | ORAL | Status: DC
  Administered 2018-05-23 – 2018-05-26 (×13): 600 mg via ORAL
  Filled 2018-05-23 (×14): qty 1

## 2018-05-23 MED ORDER — TERBUTALINE SULFATE 1 MG/ML IJ SOLN: 0.2500 mg | Freq: Once | INTRAMUSCULAR | Status: DC | PRN

## 2018-05-23 MED ORDER — ONDANSETRON HCL 4 MG/2ML IJ SOLN: 4.0000 mg | Freq: Four times a day (QID) | INTRAMUSCULAR | Status: DC | PRN

## 2018-05-23 MED ORDER — SENNOSIDES-DOCUSATE SODIUM 8.6-50 MG PO TABS
2.0000 | ORAL_TABLET | ORAL | Status: DC
  Administered 2018-05-23 – 2018-05-26 (×4): 2 via ORAL
  Filled 2018-05-23 (×4): qty 2

## 2018-05-23 MED ORDER — ONDANSETRON HCL 4 MG/2ML IJ SOLN: 4.0000 mg | INTRAMUSCULAR | Status: DC | PRN

## 2018-05-23 NOTE — Progress Notes (Signed)
OB PN:  S: Pt resting comfortably, no acute complaints  O: BP (!) 154/85   Pulse 83   Temp 98.2 F (36.8 C) (Oral)   Resp 20   Ht 5\' 6"  (1.676 m)   Wt 85.3 kg   LMP 09/06/2017   SpO2 100%   BMI 30.36 kg/m   BP range: 136-161/77-93  FHT: 135 bpm, moderate variablity, no accels, occasional early and late decels Toco: q54min SVE: 6/80/-2, AROM minimal fluid- IUPC and FSE placed  A/P: 38 y.o. G2P1001 @ [redacted]w[redacted]d for IOL due to chronic HTN with preeclampsia without severe features -chronic HTN with superimposed Preeclampsia- no severe features  Labetalol 200mg  tid  Pt asymptomatic  Plan to give IV magnesium postpartum or during labor if she progresses to severe features - FWB: Intermittent Cat. II and Cat. I tracing- currently moderate variability with early decels, no late decels noted - Labor: expectant management - Pain: continue with epidural - GBS: negative - Hypokalemia: NS +KCL running- plan to recheck K this evening, pt asymptomatic  Myna Hidalgo, DO [phone] (cell) [phone] (office)

## 2018-05-23 NOTE — Anesthesia Preprocedure Evaluation (Signed)
Anesthesia Evaluation  Patient identified by MRN, date of birth, ID band Patient awake    Reviewed: Allergy & Precautions, H&P , NPO status , Patient's Chart, lab work & pertinent test results  History of Anesthesia Complications Negative for: history of anesthetic complications  Airway Mallampati: II  TM Distance: >3 FB Neck ROM: full    Dental no notable dental hx. (+) Teeth Intact   Pulmonary neg pulmonary ROS,    Pulmonary exam normal breath sounds clear to auscultation       Cardiovascular hypertension (PIH), Normal cardiovascular exam Rhythm:regular Rate:Normal     Neuro/Psych negative neurological ROS  negative psych ROS   GI/Hepatic negative GI ROS, Neg liver ROS,   Endo/Other  negative endocrine ROSdiabetes  Renal/GU negative Renal ROS  negative genitourinary   Musculoskeletal   Abdominal   Peds  Hematology negative hematology ROS (+)   Anesthesia Other Findings   Reproductive/Obstetrics (+) Pregnancy                             Anesthesia Physical Anesthesia Plan  ASA: II  Anesthesia Plan: Epidural   Post-op Pain Management:    Induction:   PONV Risk Score and Plan:   Airway Management Planned:   Additional Equipment:   Intra-op Plan:   Post-operative Plan:   Informed Consent: I have reviewed the patients History and Physical, chart, labs and discussed the procedure including the risks, benefits and alternatives for the proposed anesthesia with the patient or authorized representative who has indicated his/her understanding and acceptance.       Plan Discussed with:   Anesthesia Plan Comments:         Anesthesia Quick Evaluation

## 2018-05-23 NOTE — Anesthesia Procedure Notes (Signed)
Epidural Patient location during procedure: OB  Staffing Anesthesiologist: Karlena Luebke, MD Performed: anesthesiologist   Preanesthetic Checklist Completed: patient identified, site marked, surgical consent, pre-op evaluation, timeout performed, IV checked, risks and benefits discussed and monitors and equipment checked  Epidural Patient position: sitting Prep: DuraPrep Patient monitoring: heart rate, continuous pulse ox and blood pressure Approach: right paramedian Location: L3-L4 Injection technique: LOR saline  Needle:  Needle type: Tuohy  Needle gauge: 17 G Needle length: 9 cm and 9 Needle insertion depth: 6 cm Catheter type: closed end flexible Catheter size: 20 Guage Catheter at skin depth: 10 cm Test dose: negative  Assessment Events: blood not aspirated, injection not painful, no injection resistance, negative IV test and no paresthesia  Additional Notes Patient identified. Risks/Benefits/Options discussed with patient including but not limited to bleeding, infection, nerve damage, paralysis, failed block, incomplete pain control, headache, blood pressure changes, nausea, vomiting, reactions to medication both or allergic, itching and postpartum back pain. Confirmed with bedside nurse the patient's most recent platelet count. Confirmed with patient that they are not currently taking any anticoagulation, have any bleeding history or any family history of bleeding disorders. Patient expressed understanding and wished to proceed. All questions were answered. Sterile technique was used throughout the entire procedure. Please see nursing notes for vital signs. Test dose was given through epidural needle and negative prior to continuing to dose epidural or start infusion. Warning signs of high block given to the patient including shortness of breath, tingling/numbness in hands, complete motor block, or any concerning symptoms with instructions to call for help. Patient was given  instructions on fall risk and not to get out of bed. All questions and concerns addressed with instructions to call with any issues.     

## 2018-05-23 NOTE — H&P (Signed)
Penny Morrison is a 38 y.o. female G2 P1001 at 37 wks and 0 days  presenting for induction of labor due to chronic hyerpertension with superimposed preeclampsia. Pregnancy also complicated by AMA, hypokalemia, fetus with down syndrome.   OB History    Gravida  2   Para  1   Term  1   Preterm      AB      Living  1     SAB      TAB      Ectopic      Multiple      Live Births  1          Past Medical History:  Diagnosis Date  . Gestational diabetes    first pregnancy only, gestational  . Hypertension   . Leukocytopenia, unspecified 11/10/2012  . Leukopenia   . Pregnancy induced hypertension   . Tachycardia   . Vertigo    Past Surgical History:  Procedure Laterality Date  . NO PAST SURGERIES     Family History: family history includes Cancer in her maternal grandmother; Healthy in her father and mother; Hypertension in her maternal grandmother. Social History:  reports that she has never smoked. She has never used smokeless tobacco. She reports that she does not drink alcohol or use drugs.     Maternal Diabetes: No Genetic Screening: Abnormal:  Results: Trisomy 21 Maternal Ultrasounds/Referrals: Normal Fetal Ultrasounds or other Referrals:  None Maternal Substance Abuse:  No Significant Maternal Medications:  Meds include: Other:  labetalol Significant Maternal Lab Results:  Lab values include: Group B Strep negative Other Comments:  None  Review of Systems  Constitutional: Negative.   HENT: Negative.   Eyes: Negative.   Respiratory: Negative.   Cardiovascular: Negative.   Gastrointestinal: Negative.   Genitourinary: Negative.   Musculoskeletal: Negative.   Skin: Negative.   Neurological: Negative.   Endo/Heme/Allergies: Negative.   Psychiatric/Behavioral: Negative.    History   Blood pressure (!) 144/93, pulse 86, temperature 98.2 F (36.8 C), temperature source Oral, resp. rate 18, height 5\' 6"  (1.676 m), weight 85.3 kg, last menstrual  period 09/06/2017. Exam Physical Exam  Vitals reviewed. Constitutional: She is oriented to person, place, and time. She appears well-developed and well-nourished.  HENT:  Head: Normocephalic and atraumatic.  Eyes: Pupils are equal, round, and reactive to light. Conjunctivae are normal.  Neck: Normal range of motion. Neck supple.  Cardiovascular: Normal rate and regular rhythm.  Respiratory: Effort normal and breath sounds normal.  GI: There is no abdominal tenderness.  Genitourinary:    Vagina normal.   Musculoskeletal: Normal range of motion.        General: No edema.  Neurological: She is alert and oriented to person, place, and time.  Skin: Skin is warm and dry.  Psychiatric: She has a normal mood and affect.    Prenatal labs: ABO, Rh: --/--/O POS (01/18 0210) Antibody: NEG (01/18 0210) Rubella: Immune (11/07 0000) RPR: Nonreactive (11/07 0000)  HBsAg: Negative (11/07 0000)  HIV: Non-reactive (11/07 0000)  GBS:   Negative   Assessment/Plan: 37 wks and 0 days with chronic hypertension and superimposed preeclampsia admitted for induction.  -Cytotec for cervical ripening.  -Continue labetalol 200 mg TID -magnesium sulfate post delivery for 24 hours sooner if severe symptoms develop -anticipate SVD -Fetus with down syndrome-Pediatrician to follow - hypokalemia- IV potassium repeat K in  This evening.  Gerald Leitz 05/23/2018, 7:31 AM

## 2018-05-23 NOTE — Progress Notes (Signed)
Penny Morrison is a 38 y.o. G2P1001 at [redacted]w[redacted]d  admitted for induction of labor due to chronic hypertension with superimposed preeclampsia. .  Subjective: Patient states contractions are more painful rated as 9 out of 10. She desires an epidrual   Objective: BP (!) 151/83   Pulse 85   Temp 98 F (36.7 C) (Axillary)   Resp 20   Ht 5\' 6"  (1.676 m)   Wt 85.3 kg   LMP 09/06/2017   BMI 30.36 kg/m  I/O last 3 completed shifts: In: 17 [I.V.:17] Out: -  No intake/output data recorded.  FHT:  FHR: 145 bpm, variability: moderate,  accelerations:  Present,  decelerations:  Absent UC:   regular, every 2 minutes SVE:   Dilation: 2 Effacement (%): 50 Station: -3 Exam by:: Mckinsley Koelzer Cook balloon placed and inflated with 60 cc   Labs: Lab Results  Component Value Date   WBC 7.4 05/23/2018   HGB 10.7 (L) 05/23/2018   HCT 31.3 (L) 05/23/2018   MCV 91.8 05/23/2018   PLT 226 05/23/2018    Assessment / Plan: Induction of labor due to preeclampsia,  progressing well on pitocin  Labor: Progressing normally Preeclampsia:  labs stable Fetal Wellbeing:  Category I and Category II currently category 1 and reassuring  Pain Control:  desires epidural  I/D:  n/a Anticipated MOD:  NSVD  Dr. Charlotta Newton covering until 5 pm and then Dr. Sallye Ober to assume care   Gerald Leitz 05/23/2018, 2:13 PM

## 2018-05-24 LAB — CBC
HCT: 29.1 % — ABNORMAL LOW (ref 36.0–46.0)
Hemoglobin: 9.6 g/dL — ABNORMAL LOW (ref 12.0–15.0)
MCH: 31.4 pg (ref 26.0–34.0)
MCHC: 33 g/dL (ref 30.0–36.0)
MCV: 95.1 fL (ref 80.0–100.0)
Platelets: 173 10*3/uL (ref 150–400)
RBC: 3.06 MIL/uL — ABNORMAL LOW (ref 3.87–5.11)
RDW: 16.6 % — ABNORMAL HIGH (ref 11.5–15.5)
WBC: 10.4 10*3/uL (ref 4.0–10.5)
nRBC: 0.2 % (ref 0.0–0.2)

## 2018-05-24 LAB — COMPREHENSIVE METABOLIC PANEL
ALT: 7 U/L (ref 0–44)
AST: 16 U/L (ref 15–41)
Albumin: 2.1 g/dL — ABNORMAL LOW (ref 3.5–5.0)
Alkaline Phosphatase: 101 U/L (ref 38–126)
Anion gap: 12 (ref 5–15)
BUN: <5 mg/dL — ABNORMAL LOW (ref 6–20)
CO2: 19 mmol/L — ABNORMAL LOW (ref 22–32)
Calcium: 8.3 mg/dL — ABNORMAL LOW (ref 8.9–10.3)
Chloride: 106 mmol/L (ref 98–111)
Creatinine, Ser: 0.65 mg/dL (ref 0.44–1.00)
GFR calc Af Amer: >60 mL/min (ref >60)
GFR calc non Af Amer: >60 mL/min (ref >60)
Glucose, Bld: 98 mg/dL (ref 70–99)
Potassium: 2.6 mmol/L — CL (ref 3.5–5.1)
Sodium: 137 mmol/L (ref 135–145)
Total Bilirubin: 0.6 mg/dL (ref 0.3–1.2)
Total Protein: 5.5 g/dL — ABNORMAL LOW (ref 6.5–8.1)

## 2018-05-24 MED ORDER — MAGNESIUM SULFATE BOLUS VIA INFUSION
4.0000 g | Freq: Once | INTRAVENOUS | Status: AC
  Administered 2018-05-24: 4 g via INTRAVENOUS
  Filled 2018-05-24: qty 500

## 2018-05-24 MED ORDER — LACTATED RINGERS IV SOLN
INTRAVENOUS | Status: DC
  Administered 2018-05-24 – 2018-05-25 (×3): via INTRAVENOUS

## 2018-05-24 MED ORDER — HYDRALAZINE HCL 20 MG/ML IJ SOLN
10.0000 mg | INTRAMUSCULAR | Status: DC | PRN
  Administered 2018-05-25 – 2018-05-26 (×2): 10 mg via INTRAVENOUS
  Filled 2018-05-24 (×2): qty 1

## 2018-05-24 MED ORDER — HYDRALAZINE HCL 20 MG/ML IJ SOLN
5.0000 mg | INTRAMUSCULAR | Status: DC | PRN
  Administered 2018-05-25 – 2018-05-26 (×2): 5 mg via INTRAVENOUS
  Filled 2018-05-24 (×2): qty 1

## 2018-05-24 MED ORDER — LABETALOL HCL 5 MG/ML IV SOLN
20.0000 mg | INTRAVENOUS | Status: DC | PRN
  Administered 2018-05-25: 20:00:00 20 mg via INTRAVENOUS
  Filled 2018-05-24: qty 4

## 2018-05-24 MED ORDER — POTASSIUM CHLORIDE CRYS ER 20 MEQ PO TBCR
40.0000 meq | EXTENDED_RELEASE_TABLET | Freq: Four times a day (QID) | ORAL | Status: AC
  Administered 2018-05-24 – 2018-05-25 (×4): 40 meq via ORAL
  Filled 2018-05-24 (×4): qty 2

## 2018-05-24 MED ORDER — POTASSIUM CHLORIDE CRYS ER 20 MEQ PO TBCR
20.0000 meq | EXTENDED_RELEASE_TABLET | Freq: Four times a day (QID) | ORAL | Status: DC
  Administered 2018-05-24 (×2): 20 meq via ORAL
  Filled 2018-05-24 (×2): qty 1

## 2018-05-24 MED ORDER — LABETALOL HCL 200 MG PO TABS
200.0000 mg | ORAL_TABLET | Freq: Two times a day (BID) | ORAL | Status: DC
  Administered 2018-05-24 (×2): 200 mg via ORAL
  Filled 2018-05-24 (×2): qty 1

## 2018-05-24 MED ORDER — MAGNESIUM SULFATE 40 G IN LACTATED RINGERS - SIMPLE
2.0000 g/h | INTRAVENOUS | Status: AC
  Administered 2018-05-24 – 2018-05-25 (×2): 2 g/h via INTRAVENOUS
  Filled 2018-05-24: qty 500

## 2018-05-24 MED ORDER — LABETALOL HCL 5 MG/ML IV SOLN: 40.0000 mg | INTRAVENOUS | Status: DC | PRN

## 2018-05-24 MED ORDER — HYDROXYZINE HCL 25 MG PO TABS
25.0000 mg | ORAL_TABLET | Freq: Three times a day (TID) | ORAL | Status: DC | PRN
  Administered 2018-05-24 – 2018-05-27 (×4): 25 mg via ORAL
  Filled 2018-05-24 (×6): qty 1

## 2018-05-24 NOTE — Progress Notes (Signed)
Notified lab of stat CMP order.

## 2018-05-24 NOTE — Progress Notes (Addendum)
CRITICAL VALUE ALERT  Critical Value:  K+- 2.6  Date & Time Notied:  05/24/2018  Provider Notified: Dr. Dion Body  Orders Received/Actions taken: told to increase potassium chloride to 40 mEq q6h.

## 2018-05-24 NOTE — Progress Notes (Signed)
Postpartum day #1, VAVD  Magnesium was not started overnight.  Pt transferred this am ~ 8 am and Magnesium started shortly thereafter.  Subjective Pt without complaints.  Lochia normal.  Pain controlled.  Breast feeding yes. 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 Pt denies headaches or visual changes.  Temp:  [97.8 F (36.6 C)-98.4 F (36.9 C)] 98.1 F (36.7 C) (05/23 1032) Pulse Rate:  [69-102] 87 (05/23 1032) Resp:  [17-20] 18 (05/23 1032) BP: (135-161)/(66-97) 142/87 (05/23 1032) SpO2:  [98 %-100 %] 100 % (05/23 1032)  Gen:  NAD, A&O x 3 Pt with ice pack on her chest to help with hot flushing. Uterine fundus:  Firm, nontender Lochia normal Ext:  Minimal Edema, no calf tenderness bilaterally  CBC    Component Value Date/Time   WBC 10.4 05/24/2018 0514   RBC 3.06 (L) 05/24/2018 0514   HGB 9.6 (L) 05/24/2018 0514   HGB 13.2 11/10/2012 1109   HCT 29.1 (L) 05/24/2018 0514   HCT 40.0 11/10/2012 1109   PLT 173 05/24/2018 0514   PLT 194 11/10/2012 1109   MCV 95.1 05/24/2018 0514   MCV 97.7 11/10/2012 1109   MCH 31.4 05/24/2018 0514   MCHC 33.0 05/24/2018 0514   RDW 16.6 (H) 05/24/2018 0514   RDW 13.1 11/10/2012 1109   LYMPHSABS 1.8 05/07/2018 2035   LYMPHSABS 1.7 11/10/2012 1109   MONOABS 0.7 05/07/2018 2035   MONOABS 0.2 11/10/2012 1109   EOSABS 0.1 05/07/2018 2035   EOSABS 0.1 11/10/2012 1109   BASOSABS 0.0 05/07/2018 2035   BASOSABS 0.0 11/10/2012 1109     A/P: S/p VAVD doing well. H/o CHTN with Superimposed Preeclampsia  Continue Magnesium for 24 hours.   Resume BP medications, Labetalol 200 mg BID.  Informed pt she will need to be observed after Magnesium to make sure BP is well controlled. Routine postpartum care. Lactation support. Discharge when BP controlled on PO medication. 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  Mother has been updated on his status.  Geryl Rankins 05/24/2018, 11:21 AM

## 2018-05-24 NOTE — Progress Notes (Addendum)
Rn called Midwife regarding Potassium order clarification. Rn called regarding BP of 149/86. Blood pressure parameters given were to call if BP was160/110.

## 2018-05-24 NOTE — Progress Notes (Signed)
S: Pt stable.  RN called with BP increasing and potassium of 2.6 this am.  Last night had discussion with RN about how to run potassium runs x 3 and changing the current mainline IV fluids to Lactated Ringers. RN called pharmacy for clarification regarding potassium.  At that time the active orders where confirmed regarding the need to start the potassium runs, repeat CMP in am and the patient having Magnesium Sulfate for 24 hours postpartum. O: Vitals:   05/24/18 0245 05/24/18 0644  BP: (!) 158/93 (!) 155/66  Pulse: 71 72  Resp: 18 18  Temp: 98 F (36.7 C) 98.3 F (36.8 C)  SpO2:     Results for orders placed or performed during the hospital encounter of 05/23/18 (from the past 24 hour(s))  CBC     Status: Abnormal   Collection Time: 05/23/18  2:25 PM  Result Value Ref Range   WBC 7.1 4.0 - 10.5 K/uL   RBC 3.40 (L) 3.87 - 5.11 MIL/uL   Hemoglobin 10.5 (L) 12.0 - 15.0 g/dL   HCT 02.731.7 (L) 25.336.0 - 66.446.0 %   MCV 93.2 80.0 - 100.0 fL   MCH 30.9 26.0 - 34.0 pg   MCHC 33.1 30.0 - 36.0 g/dL   RDW 40.316.4 (H) 47.411.5 - 25.915.5 %   Platelets 221 150 - 400 K/uL   nRBC 0.0 0.0 - 0.2 %  Potassium     Status: Abnormal   Collection Time: 05/23/18  2:28 PM  Result Value Ref Range   Potassium 2.6 (LL) 3.5 - 5.1 mmol/L  Potassium     Status: Abnormal   Collection Time: 05/23/18  7:25 PM  Result Value Ref Range   Potassium 2.6 (LL) 3.5 - 5.1 mmol/L  CBC     Status: Abnormal   Collection Time: 05/23/18  7:25 PM  Result Value Ref Range   WBC 8.5 4.0 - 10.5 K/uL   RBC 3.37 (L) 3.87 - 5.11 MIL/uL   Hemoglobin 10.5 (L) 12.0 - 15.0 g/dL   HCT 56.331.8 (L) 87.536.0 - 64.346.0 %   MCV 94.4 80.0 - 100.0 fL   MCH 31.2 26.0 - 34.0 pg   MCHC 33.0 30.0 - 36.0 g/dL   RDW 32.916.6 (H) 51.811.5 - 84.115.5 %   Platelets 205 150 - 400 K/uL   nRBC 0.0 0.0 - 0.2 %  Comprehensive metabolic panel     Status: Abnormal   Collection Time: 05/24/18  5:14 AM  Result Value Ref Range   Sodium 137 135 - 145 mmol/L   Potassium 2.6 (LL) 3.5 - 5.1  mmol/L   Chloride 106 98 - 111 mmol/L   CO2 19 (L) 22 - 32 mmol/L   Glucose, Bld 98 70 - 99 mg/dL   BUN <5 (L) 6 - 20 mg/dL   Creatinine, Ser 6.600.65 0.44 - 1.00 mg/dL   Calcium 8.3 (L) 8.9 - 10.3 mg/dL   Total Protein 5.5 (L) 6.5 - 8.1 g/dL   Albumin 2.1 (L) 3.5 - 5.0 g/dL   AST 16 15 - 41 U/L   ALT 7 0 - 44 U/L   Alkaline Phosphatase 101 38 - 126 U/L   Total Bilirubin 0.6 0.3 - 1.2 mg/dL   GFR calc non Af Amer >60 >60 mL/min   GFR calc Af Amer >60 >60 mL/min   Anion gap 12 5 - 15  CBC     Status: Abnormal   Collection Time: 05/24/18  5:14 AM  Result Value Ref Range  WBC 10.4 4.0 - 10.5 K/uL   RBC 3.06 (L) 3.87 - 5.11 MIL/uL   Hemoglobin 9.6 (L) 12.0 - 15.0 g/dL   HCT 65.0 (L) 35.4 - 65.6 %   MCV 95.1 80.0 - 100.0 fL   MCH 31.4 26.0 - 34.0 pg   MCHC 33.0 30.0 - 36.0 g/dL   RDW 81.2 (H) 75.1 - 70.0 %   Platelets 173 150 - 400 K/uL   nRBC 0.2 0.0 - 0.2 %  A:VAD PPD:1 with chronic CTH with superimposed pre eclampsia Hypokalemia P: The patient did not receive Magnesium Sulfate as order or discussed with RN.  Pt will be transferred to have prescribed order.   Dr. Dion Body aware and aware of low potassium and will do further management.   Patient to start Labetalol after the completion of Magnesium Sulfate at 200 mg TID.

## 2018-05-24 NOTE — Anesthesia Postprocedure Evaluation (Signed)
Anesthesia Post Note  Patient: Penny Morrison  Procedure(s) Performed: AN AD HOC LABOR EPIDURAL     Patient location during evaluation: Mother Baby Anesthesia Type: Epidural Level of consciousness: awake Pain management: satisfactory to patient Vital Signs Assessment: post-procedure vital signs reviewed and stable Respiratory status: spontaneous breathing Cardiovascular status: stable Anesthetic complications: no    Last Vitals:  Vitals:   05/24/18 0245 05/24/18 0644  BP: (!) 158/93 (!) 155/66  Pulse: 71 72  Resp: 18 18  Temp: 36.7 C 36.8 C  SpO2:      Last Pain:  Vitals:   05/24/18 0645  TempSrc:   PainSc: 0-No pain   Pain Goal:                   [name]

## 2018-05-24 NOTE — Progress Notes (Signed)
Pt called out for RN, stating that she was feeling pressure in her right upper abdomen, rating it 7/10. BP 151/98 and O2 100%. Lungs clear and pt denies H/A and blurred vision. Dr. Dion Body notified.  Received orders for stat CMP and was told to call her back with the results.

## 2018-05-25 LAB — COMPREHENSIVE METABOLIC PANEL
ALT: 10 U/L (ref 0–44)
ALT: 9 U/L (ref 0–44)
AST: 18 U/L (ref 15–41)
AST: 20 U/L (ref 15–41)
Albumin: 2.3 g/dL — ABNORMAL LOW (ref 3.5–5.0)
Albumin: 2.3 g/dL — ABNORMAL LOW (ref 3.5–5.0)
Alkaline Phosphatase: 108 U/L (ref 38–126)
Alkaline Phosphatase: 99 U/L (ref 38–126)
Anion gap: 8 (ref 5–15)
Anion gap: 12 (ref 5–15)
BUN: <5 mg/dL — ABNORMAL LOW (ref 6–20)
BUN: <5 mg/dL — ABNORMAL LOW (ref 6–20)
CO2: 22 mmol/L (ref 22–32)
CO2: 23 mmol/L (ref 22–32)
Calcium: 7.8 mg/dL — ABNORMAL LOW (ref 8.9–10.3)
Calcium: 6.9 mg/dL — ABNORMAL LOW (ref 8.9–10.3)
Chloride: 105 mmol/L (ref 98–111)
Chloride: 102 mmol/L (ref 98–111)
Creatinine, Ser: 0.72 mg/dL (ref 0.44–1.00)
Creatinine, Ser: 0.63 mg/dL (ref 0.44–1.00)
GFR calc Af Amer: >60 mL/min (ref >60)
GFR calc Af Amer: >60 mL/min (ref >60)
GFR calc non Af Amer: >60 mL/min (ref >60)
GFR calc non Af Amer: >60 mL/min (ref >60)
Glucose, Bld: 100 mg/dL — ABNORMAL HIGH (ref 70–99)
Glucose, Bld: 111 mg/dL — ABNORMAL HIGH (ref 70–99)
Potassium: 2.6 mmol/L — CL (ref 3.5–5.1)
Potassium: 2.8 mmol/L — ABNORMAL LOW (ref 3.5–5.1)
Sodium: 135 mmol/L (ref 135–145)
Sodium: 137 mmol/L (ref 135–145)
Total Bilirubin: 0.6 mg/dL (ref 0.3–1.2)
Total Bilirubin: 0.7 mg/dL (ref 0.3–1.2)
Total Protein: 5.8 g/dL — ABNORMAL LOW (ref 6.5–8.1)
Total Protein: 5.9 g/dL — ABNORMAL LOW (ref 6.5–8.1)

## 2018-05-25 MED ORDER — LABETALOL HCL 200 MG PO TABS
400.0000 mg | ORAL_TABLET | Freq: Three times a day (TID) | ORAL | Status: DC
  Administered 2018-05-25 – 2018-05-26 (×3): 400 mg via ORAL
  Filled 2018-05-25: qty 2
  Filled 2018-05-25: qty 4
  Filled 2018-05-25: qty 2

## 2018-05-25 MED ORDER — AMLODIPINE BESYLATE 5 MG PO TABS
5.0000 mg | ORAL_TABLET | Freq: Every day | ORAL | Status: DC
  Administered 2018-05-25 – 2018-05-27 (×3): 5 mg via ORAL
  Filled 2018-05-25 (×3): qty 1

## 2018-05-25 MED ORDER — LABETALOL HCL 200 MG PO TABS
400.0000 mg | ORAL_TABLET | Freq: Two times a day (BID) | ORAL | Status: DC
  Administered 2018-05-25: 10:00:00 400 mg via ORAL
  Filled 2018-05-25: qty 2

## 2018-05-25 NOTE — Progress Notes (Signed)
Postpartum day #2, VAVD  Magnesium was discontinued right before my arrival.  Pt with RUQ pain yesterday while on Magnesium.  Bps were mildly elevated at the time, CMP normal.  Pain decreased during w/u.  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  Stable, doing well per pt Pt denies headaches or visual changes.  Temp:  [97.8 F (36.6 C)-98.7 F (37.1 C)] 98.3 F (36.8 C) (05/24 0834) Pulse Rate:  [87-103] 103 (05/24 0834) Resp:  [16-20] 18 (05/24 0834) BP: (129-156)/(66-98) 147/88 (05/24 0834) SpO2:  [97 %-100 %] 99 % (05/24 0834)   Intake/Output Summary (Last 24 hours) at 05/25/2018 1034 Last data filed at 05/25/2018 0932 Gross per 24 hour  Intake 3580.19 ml  Output 9900 ml  Net -6319.81 ml   Gen:  NAD, A&O x 3 .  Upright in bed Uterine fundus:  Firm, nontender Lochia normal Ext:  Minimal Edema, no calf tenderness bilaterally  CBC CBC Latest Ref Rng & Units 05/24/2018 05/23/2018 05/23/2018  WBC 4.0 - 10.5 K/uL 10.4 8.5 7.1  Hemoglobin 12.0 - 15.0 g/dL 7.4(Y) 10.5(L) 10.5(L)  Hematocrit 36.0 - 46.0 % 29.1(L) 31.8(L) 31.7(L)  Platelets 150 - 400 K/uL 173 205 221   CMP Latest Ref Rng & Units 05/25/2018 05/24/2018 05/24/2018  Glucose 70 - 99 mg/dL 814(G) 818(H) 98  BUN 6 - 20 mg/dL <6(D) <1(S) <9(F)  Creatinine 0.44 - 1.00 mg/dL 0.26 3.78 5.88  Sodium 135 - 145 mmol/L 137 135 137  Potassium 3.5 - 5.1 mmol/L 2.8(L) 2.6(LL) 2.6(LL)  Chloride 98 - 111 mmol/L 102 105 106  CO2 22 - 32 mmol/L 23 22 19(L)  Calcium 8.9 - 10.3 mg/dL 6.9(L) 7.8(L) 8.3(L)  Total Protein 6.5 - 8.1 g/dL 5.9(L) 5.8(L) 5.5(L)  Total Bilirubin 0.3 - 1.2 mg/dL 0.7 0.6 0.6  Alkaline Phos 38 - 126 U/L 99 108 101  AST 15 - 41 U/L 20 18 16   ALT 0 - 44 U/L 9 10 7      A/P: S/p VAVD doing well. H/o CHTN with Superimposed Preeclampsia  -S/p Magnesium for 24 hours.   -Bp above goal while on Magnesium so anticipate it will increase after Magnesium  discontinued.  -Increase to Labetalol 400 mg BID.  -Informed pt she will need to be observed after Magnesium to make sure BP is well controlled.  If well controlled up to 1800,  consider discharge so pt can see infant. Pt reports she has a BP cuff at home. Routine postpartum care. Lactation support. Discharge when BP controlled on PO medication. 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    Penny Morrison 05/25/2018, 9:52 AM

## 2018-05-25 NOTE — Progress Notes (Signed)
Dr. Dion Body notified. Told to start apresoline protocol and change labetalol frequency to three times a day. Told to give second dose now.

## 2018-05-25 NOTE — Progress Notes (Signed)
In to assess at ~ 8:45 pm pt after severe range BPs.  Pt s/p Hydralazine IV 5 and 10 mg followed by Labetalol IV 20 mg.  Pt was given an order to start Labetalol 400 mg TID.  Upon my arrival, pt is doing well.  Denies headaches, visual changes.  Previous upper abdominal pain was more pressure and has resolved.   Recommend adding a second agent in that pt had severe range BP.  Recommended Procardia but pt states she tried it early in pregnancy and it caused leg pain   BP (!) 158/78 (BP Location: Left Arm)   Pulse 92   Temp 98.6 F (37 C) (Oral)   Resp 18   Ht 5\' 6"  (1.676 m)   Wt 85.3 kg   LMP 09/06/2017   SpO2 99%   Breastfeeding Unknown   BMI 30.36 kg/m   Gen:  NAD.  CHTN with superimposed Preeclampsia.    BP uncontrolled.  Add Norvasc 5 mg to Labetalol 400 mg TID.

## 2018-05-25 NOTE — Lactation Note (Signed)
Lactation Consultation Note  Patient Name: ALTIE BUSA [id number] Date: 05/25/2018   [facility] Initial Visit:  P2 mother whose baby has been transferred to [facility] due to choanal atresia and Down's Syndrome.  Mother breast fed her first child (almost 37 years old) for 4 months.  She hopes to breast feed this baby for a longer period of time.  Mother is hoping to be discharged either tonight or tomorrow.  Reviewed pump and pump set up with her.  Taught hand expression and mother was able to express drops of colostrum which we collected in a colostrum container.  Provided labels and discussed transporting milk to [facility].  Mother has one bottle in the refrigerator with approximately 5 mls of EBM.  Encouraged to pump 8-12 times/24 hours and include hand expression before/after pumping to help increase milk supply.  Mother has a DEBP for home use.  She had no questions at this time.  She will be a "stay at home" mother after discharge.  Father present.  Instructed mother to call me as needed for concerns.  Mother appreciative of assistance.                   Lancelot Alyea R Barton Want 05/25/2018, 9:28 AM

## 2018-05-26 MED ORDER — FUROSEMIDE 10 MG/ML IJ SOLN
20.0000 mg | Freq: Four times a day (QID) | INTRAMUSCULAR | Status: AC
  Administered 2018-05-26 (×2): 20 mg via INTRAVENOUS
  Filled 2018-05-26 (×2): qty 2

## 2018-05-26 MED ORDER — LABETALOL HCL 200 MG PO TABS
400.0000 mg | ORAL_TABLET | Freq: Three times a day (TID) | ORAL | Status: DC
  Administered 2018-05-26 – 2018-05-27 (×3): 400 mg via ORAL
  Filled 2018-05-26: qty 2
  Filled 2018-05-26 (×2): qty 4

## 2018-05-26 MED ORDER — POTASSIUM CHLORIDE CRYS ER 20 MEQ PO TBCR
40.0000 meq | EXTENDED_RELEASE_TABLET | Freq: Four times a day (QID) | ORAL | Status: AC
  Administered 2018-05-27 (×2): 40 meq via ORAL
  Filled 2018-05-26 (×2): qty 2

## 2018-05-26 MED ORDER — POTASSIUM CHLORIDE CRYS ER 20 MEQ PO TBCR
40.0000 meq | EXTENDED_RELEASE_TABLET | Freq: Four times a day (QID) | ORAL | Status: DC
  Administered 2018-05-26: 40 meq via ORAL
  Filled 2018-05-26 (×2): qty 2

## 2018-05-26 NOTE — Plan of Care (Signed)
Pt. Required second dose of hydral 10 mg IV. BP began to come down. Dr. Dion Body called and POC discussed. Pt. Given 20mg  IV lasix, and started on I/O. Pt. Educated on need for I/O and indication of IV KCL. Pt. In agreement with POC. Will give PO labetalol at 1730 as directed. Will continue to monitor.

## 2018-05-26 NOTE — Progress Notes (Signed)
Dr. Dion Body called re: BP. Asked to restart the hydral protocol. 5mg  IV hydral given. Pt. Updated re: POC. Tearful as she hoped to leave today. Explained importance of stable BP, support given. Will monitor.

## 2018-05-26 NOTE — Progress Notes (Signed)
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  Stable, doing well per pt Pt denies headaches, upper abdominal pain or visual changes.  Pt is breast feeding and is pumping.  Temp:  [98.3 F (36.8 C)-100 F (37.8 C)] 98.3 F (36.8 C) (05/25 0800) Pulse Rate:  [84-107] 88 (05/25 0800) Resp:  [17-20] 18 (05/25 0800) BP: (141-171)/(78-96) 154/80 (05/25 0800) SpO2:  [98 %-99 %] 99 % (05/24 2152)   Intake/Output Summary (Last 24 hours) at 05/26/2018 0953 Last data filed at 05/26/2018 0545 Gross per 24 hour  Intake 902.26 ml  Output 2700 ml  Net -1797.74 ml   Gen:  NAD, A&O x 3 .  Upright in bed Abd:  No upper abdominal pain. Ext:  Minimal Edema, no calf tenderness bilaterally  CBC CBC Latest Ref Rng & Units 05/24/2018 05/23/2018 05/23/2018  WBC 4.0 - 10.5 K/uL 10.4 8.5 7.1  Hemoglobin 12.0 - 15.0 g/dL 8.4(T) 10.5(L) 10.5(L)  Hematocrit 36.0 - 46.0 % 29.1(L) 31.8(L) 31.7(L)  Platelets 150 - 400 K/uL 173 205 221   CMP Latest Ref Rng & Units 05/25/2018 05/24/2018 05/24/2018  Glucose 70 - 99 mg/dL 364(W) 803(O) 98  BUN 6 - 20 mg/dL <1(Y) <2(Q) <8(G)  Creatinine 0.44 - 1.00 mg/dL 5.00 3.70 4.88  Sodium 135 - 145 mmol/L 137 135 137  Potassium 3.5 - 5.1 mmol/L 2.8(L) 2.6(LL) 2.6(LL)  Chloride 98 - 111 mmol/L 102 105 106  CO2 22 - 32 mmol/L 23 22 19(L)  Calcium 8.9 - 10.3 mg/dL 6.9(L) 7.8(L) 8.3(L)  Total Protein 6.5 - 8.1 g/dL 5.9(L) 5.8(L) 5.5(L)  Total Bilirubin 0.3 - 1.2 mg/dL 0.7 0.6 0.6  Alkaline Phos 38 - 126 U/L 99 108 101  AST 15 - 41 U/L 20 18 16   ALT 0 - 44 U/L 9 10 7      A/P: S/p VAVD doing well. H/o CHTN with Superimposed Preeclampsia  -S/p Magnesium for 24 hours.   -BP not well controlled yesterday as she required a series of IV antihypertensives.  Current regimen is Norvasc 5 mg and  Labetalol 400 mg TID.  Will observe BP, goal is ~ 140/90 consistently for  discharge.  Discharge when BP controlled on PO  medication. Routine postpartum care. Lactation support. 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  Pt to meet with team at [facility] today to discuss treatment plan.    Geryl Rankins 05/26/2018, 9:53 AM

## 2018-05-26 NOTE — Lactation Note (Signed)
Lactation Consultation Note  Patient Name: Penny Morrison [id number] Date: 05/26/2018   [facility] Follow Up Visit:  P2 mother whose baby was transferred to [facility] due to choanal atresia and Down's Syndrome.  Mother breast fed her first child (almost 37 years old) for 4 months.  She plans to breast feed this baby for a longer period of time.  Mother stated that she has been having some difficulty with her BP and BP medications.  She is hoping to be stable enough today to be discharged later otday or tonight.  Mother had no questions/concerns related to pumping.  She continues to pump and has now obtained 5 mls at the last pumping session.  Taught hand expression yesterday and mother will continue to do this before/after pumping to help increase milk supply.    Mother has a DEBP for home use.  She will be a "stay at home" mother after discharge.  Father present.  Mother will call as needed.  She has our OP phone number for concerns after discharge.     Laveda Demedeiros R Romelle Muldoon 05/26/2018, 9:50 AM

## 2018-05-26 NOTE — Discharge Instructions (Signed)
.  Preeclampsia and Eclampsia    Preeclampsia is a serious condition that may develop during pregnancy. It is also called toxemia of pregnancy. This condition causes high blood pressure along with other symptoms, such as swelling and headaches. These symptoms may develop as the condition gets worse. Preeclampsia may occur at 20 weeks of pregnancy or later.  Diagnosing and treating preeclampsia early is very important. If not treated early, it can cause serious problems for you and your baby. One problem it can lead to is eclampsia. Eclampsia is a condition that causes muscle jerking or shaking (convulsions or seizures) and other serious problems for the mother. During pregnancy, delivering your baby may be the best treatment for preeclampsia or eclampsia. For most women, preeclampsia and eclampsia symptoms go away after giving birth.  In rare cases, a woman may develop preeclampsia after giving birth (postpartum preeclampsia). This usually occurs within 48 hours after childbirth but may occur up to 6 weeks after giving birth.  What are the causes?  The cause of preeclampsia is not known.  What increases the risk?  The following risk factors make you more likely to develop preeclampsia:   Being pregnant for the first time.   Having had preeclampsia during a past pregnancy.   Having a family history of preeclampsia.   Having high blood pressure.   Being pregnant with more than one baby.   Being 35 or older.   Being African-American.   Having kidney disease or diabetes.   Having medical conditions such as lupus or blood diseases.   Being very overweight (obese).  What are the signs or symptoms?  The earliest signs of preeclampsia are:   High blood pressure.   Increased protein in your urine. Your health care provider will check for this at every visit before you give birth (prenatal visit).  Other symptoms that may develop as the condition gets worse include:   Severe headaches.   Sudden weight  gain.   Swelling of the hands, face, legs, and feet.   Nausea and vomiting.   Vision problems, such as blurred or double vision.   Numbness in the face, arms, legs, and feet.   Urinating less than usual.   Dizziness.   Slurred speech.   Abdominal pain, especially upper abdominal pain.   Convulsions or seizures.  How is this diagnosed?  There are no screening tests for preeclampsia. Your health care provider will ask you about symptoms and check for signs of preeclampsia during your prenatal visits. You may also have tests that include:   Urine tests.   Blood tests.   Checking your blood pressure.   Monitoring your baby's heart rate.   Ultrasound.  How is this treated?  You and your health care provider will determine the treatment approach that is best for you. Treatment may include:   Having more frequent prenatal exams to check for signs of preeclampsia, if you have an increased risk for preeclampsia.   Medicine to lower your blood pressure.   Staying in the hospital, if your condition is severe. There, treatment will focus on controlling your blood pressure and the amount of fluids in your body (fluid retention).   Taking medicine (magnesium sulfate) to prevent seizures. This may be given as an injection or through an IV.   Taking a low-dose aspirin during your pregnancy.   Delivering your baby early, if your condition gets worse. You may have your labor started with medicine (induced), or you may have a cesarean   Do not use alcohol or drugs.  Avoid stress as much as possible. Rest and get plenty of sleep. General instructions  Take over-the-counter and prescription medicines only as  told by your health care provider.  When lying down, lie on your left side. This keeps pressure off your major blood vessels.  When sitting or lying down, raise (elevate) your feet. Try putting some pillows underneath your lower legs.  Exercise regularly. Ask your health care provider what kinds of exercise are best for you.  Keep all follow-up and prenatal visits as told by your health care provider. This is important. How is this prevented? There is no known way of preventing preeclampsia or eclampsia from developing. However, to lower your risk of complications and detect problems early:  Get regular prenatal care. Your health care provider may be able to diagnose and treat the condition early.  Maintain a healthy weight. Ask your health care provider for help managing weight gain during pregnancy.  Work with your health care provider to manage any long-term (chronic) health conditions you have, such as diabetes or kidney problems.  You may have tests of your blood pressure and kidney function after giving birth.  Your health care provider may have you take low-dose aspirin during your next pregnancy. Contact a health care provider if:  You have symptoms that your health care provider told you may require more treatment or monitoring, such as: ? Headaches. ? Nausea or vomiting. ? Abdominal pain. ? Dizziness. ? Light-headedness. Get help right away if:  You have severe: ? Abdominal pain. ? Headaches that do not get better. ? Dizziness. ? Vision problems. ? Confusion. ? Nausea or vomiting.  You have any of the following: ? A seizure. ? Sudden, rapid weight gain. ? Sudden swelling in your hands, ankles, or face. ? Trouble moving any part of your body. ? Numbness in any part of your body. ? Trouble speaking. ? Abnormal bleeding.  You faint. Summary  Preeclampsia is a serious condition that may develop during pregnancy. It is also called toxemia of pregnancy.  This  condition causes high blood pressure along with other symptoms, such as swelling and headaches.  Diagnosing and treating preeclampsia early is very important. If not treated early, it can cause serious problems for you and your baby.  Get help right away if you have symptoms that your health care provider told you to watch for. This information is not intended to replace advice given to you by your health care provider. Make sure you discuss any questions you have with your health care provider. Document Released: 12/16/1999 Document Revised: 12/04/2016 Document Reviewed: 07/25/2015 Elsevier Interactive Patient Education  2019 Elsevier Inc.   Postpartum Care After Vaginal Delivery This sheet gives you information about how to care for yourself from the time you deliver your baby to up to 6-12 weeks after delivery (postpartum period). Your health care provider may also give you more specific instructions. If you have problems or questions, contact your health care provider. Follow these instructions at home: Vaginal bleeding  It is normal to have vaginal bleeding (lochia) after delivery. Wear a sanitary pad for vaginal bleeding and discharge. ? During the first week after delivery, the amount and appearance of lochia is often similar to a menstrual period. ? Over the next few weeks, it will gradually decrease to a dry, yellow-brown discharge. ? For most women, lochia stops completely by 4-6 weeks after delivery. Vaginal bleeding can vary from woman to woman.  Change  your sanitary pads frequently. Watch for any changes in your flow, such as: ? A sudden increase in volume. ? A change in color. ? Large blood clots.  If you pass a blood clot from your vagina, save it and call your health care provider to discuss. Do not flush blood clots down the toilet before talking with your health care provider.  Do not use tampons or douches until your health care provider says this is safe.  If you are  not breastfeeding, your period should return 6-8 weeks after delivery. If you are feeding your child breast milk only (exclusive breastfeeding), your period may not return until you stop breastfeeding. Perineal care  Keep the area between the vagina and the anus (perineum) clean and dry as told by your health care provider. Use medicated pads and pain-relieving sprays and creams as directed.  If you had a cut in the perineum (episiotomy) or a tear in the vagina, check the area for signs of infection until you are healed. Check for: ? More redness, swelling, or pain. ? Fluid or blood coming from the cut or tear. ? Warmth. ? Pus or a bad smell.  You may be given a squirt bottle to use instead of wiping to clean the perineum area after you go to the bathroom. As you start healing, you may use the squirt bottle before wiping yourself. Make sure to wipe gently.  To relieve pain caused by an episiotomy, a tear in the vagina, or swollen veins in the anus (hemorrhoids), try taking a warm sitz bath 2-3 times a day. A sitz bath is a warm water bath that is taken while you are sitting down. The water should only come up to your hips and should cover your buttocks. Breast care  Within the first few days after delivery, your breasts may feel heavy, full, and uncomfortable (breast engorgement). Milk may also leak from your breasts. Your health care provider can suggest ways to help relieve the discomfort. Breast engorgement should go away within a few days.  If you are breastfeeding: ? Wear a bra that supports your breasts and fits you well. ? Keep your nipples clean and dry. Apply creams and ointments as told by your health care provider. ? You may need to use breast pads to absorb milk that leaks from your breasts. ? You may have uterine contractions every time you breastfeed for up to several weeks after delivery. Uterine contractions help your uterus return to its normal size. ? If you have any problems  with breastfeeding, work with your health care provider or Advertising copywriter.  If you are not breastfeeding: ? Avoid touching your breasts a lot. Doing this can make your breasts produce more milk. ? Wear a good-fitting bra and use cold packs to help with swelling. ? Do not squeeze out (express) milk. This causes you to make more milk. Intimacy and sexuality  Ask your health care provider when you can engage in sexual activity. This may depend on: ? Your risk of infection. ? How fast you are healing. ? Your comfort and desire to engage in sexual activity.  You are able to get pregnant after delivery, even if you have not had your period. If desired, talk with your health care provider about methods of birth control (contraception). Medicines  Take over-the-counter and prescription medicines only as told by your health care provider.  If you were prescribed an antibiotic medicine, take it as told by your health care provider. Do  not stop taking the antibiotic even if you start to feel better. Activity  Gradually return to your normal activities as told by your health care provider. Ask your health care provider what activities are safe for you.  Rest as much as possible. Try to rest or take a nap while your baby is sleeping. Eating and drinking   Drink enough fluid to keep your urine pale yellow.  Eat high-fiber foods every day. These may help prevent or relieve constipation. High-fiber foods include: ? Whole grain cereals and breads. ? Brown rice. ? Beans. ? Fresh fruits and vegetables.  Do not try to lose weight quickly by cutting back on calories.  Take your prenatal vitamins until your postpartum checkup or until your health care provider tells you it is okay to stop. Lifestyle  Do not use any products that contain nicotine or tobacco, such as cigarettes and e-cigarettes. If you need help quitting, ask your health care provider.  Do not drink alcohol, especially if you  are breastfeeding. General instructions  Keep all follow-up visits for you and your baby as told by your health care provider. Most women visit their health care provider for a postpartum checkup within the first 3-6 weeks after delivery. Contact a health care provider if:  You feel unable to cope with the changes that your child brings to your life, and these feelings do not go away.  You feel unusually sad or worried.  Your breasts become red, painful, or hard.  You have a fever.  You have trouble holding urine or keeping urine from leaking.  You have little or no interest in activities you used to enjoy.  You have not breastfed at all and you have not had a menstrual period for 12 weeks after delivery.  You have stopped breastfeeding and you have not had a menstrual period for 12 weeks after you stopped breastfeeding.  You have questions about caring for yourself or your baby.  You pass a blood clot from your vagina. Get help right away if:  You have chest pain.  You have difficulty breathing.  You have sudden, severe leg pain.  You have severe pain or cramping in your lower abdomen.  You bleed from your vagina so much that you fill more than one sanitary pad in one hour. Bleeding should not be heavier than your heaviest period.  You develop a severe headache.  You faint.  You have blurred vision or spots in your vision.  You have bad-smelling vaginal discharge.  You have thoughts about hurting yourself or your baby. If you ever feel like you may hurt yourself or others, or have thoughts about taking your own life, get help right away. You can go to the nearest emergency department or call:  Your local emergency services (911 in the [place]  A suicide crisis helpline, such as the National Suicide Prevention Lifeline at 931 383 3066. This is open 24 hours a day. Summary  The period of time right after you deliver your newborn up to 6-12 weeks after delivery is  called the postpartum period.  Gradually return to your normal activities as told by your health care provider.  Keep all follow-up visits for you and your baby as told by your health care provider. This information is not intended to replace advice given to you by your health care provider. Make sure you discuss any questions you have with your health care provider. Document Released: 10/15/2006 Document Revised: 10/01/2016 Document Reviewed: 10/01/2016 Elsevier Interactive Patient  Education  2019 [employer]

## 2018-05-27 MED ORDER — IBUPROFEN 600 MG PO TABS: 600.0000 mg | ORAL_TABLET | Freq: Four times a day (QID) | ORAL | 1 refills | Status: AC | PRN

## 2018-05-27 MED ORDER — LABETALOL HCL 200 MG PO TABS: 400.0000 mg | ORAL_TABLET | Freq: Three times a day (TID) | ORAL | 1 refills | Status: AC

## 2018-05-27 MED ORDER — AMLODIPINE BESYLATE 5 MG PO TABS: 5.0000 mg | ORAL_TABLET | Freq: Every day | ORAL | 1 refills | Status: AC

## 2018-05-27 MED ORDER — POTASSIUM CHLORIDE ER 20 MEQ PO TBCR: EXTENDED_RELEASE_TABLET | ORAL | 0 refills | Status: AC

## 2018-05-27 NOTE — Progress Notes (Signed)
Discharge teaching complete with pt. Pt understood all information and did not have any questions. Preeclampsia information sheet given to pt. BP meds discussed with pt. Pt understood all information and did not have any questions.

## 2018-05-27 NOTE — Lactation Note (Signed)
Lactation Consultation Note  Patient Name: Penny Morrison Date: 05/27/2018   Infant at [facility].  Mother anticipates discharge today. She has been pumping 60 ml +. Reviewed milk storage.  Mother will have FOB take some breastmilk home to freeze until they are able to go to [facility].  Referred her to NICU booklet for guidelines. Provided mother with additional bottles for breastmilk storage and reviewed engorgement care. Mother has personal DEBP.       Maternal Data    Feeding    LATCH Score                   Interventions    Lactation Tools Discussed/Used     Consult Status      [name] [facility] 05/27/2018, 8:07 AM

## 2018-05-27 NOTE — Progress Notes (Signed)
Post Partum Day 4 Subjective: no complaints, up ad lib and voiding  Denies headache visual disturbance or ruq pain  Objective: Blood pressure (!) 144/78, pulse 79, temperature 98.2 F (36.8 C), temperature source Oral, resp. rate 18, height 5\' 6"  (1.676 m), weight 85.3 kg, last menstrual period 09/06/2017, SpO2 98 %, unknown if currently breastfeeding.  Physical Exam:  General: alert, cooperative and no distress Lochia: appropriate Uterine Fundus: firm Incision: NA DVT Evaluation: No evidence of DVT seen on physical exam.  No results for input(s): HGB, HCT in the last 72 hours.  Assessment/Plan: Discharge home  Chronic hypertension with superimposed preeclampsia- bp well controlled with labetolol 400 mg tid and norvasc 5 mg daily pt to continue this outpatient.Marland Kitchen bp check in the office in 1 week.  Hypokalemia. Home on kdur 20 meq bid     LOS: 4 days   Gerald Leitz 05/27/2018, 9:14 AM

## 2018-06-16 NOTE — Discharge Summary (Signed)
Physician Discharge Summary  Patient ID: Penny Morrison MRN: [id number] DOB/AGE: [id number] 37 y.o.  Admit date: 05/23/2018 Discharge date: 05/27/2018 Admission Diagnoses: chronic hypertension with superimposed preeclampsia  .. fetus with down syndrome   Discharge Diagnoses:  Active Problems:   Chronic hypertension with superimposed preeclampsia   Discharged Condition: stable  Hospital Course: pt was admitted for induction at 37 wks due to chronic hypertension with superimposed preeclampsia. She had a vacuum assisted vaginal delivery. bp increased after delivery she is discharged home on 400 mg of labetalol tid and norvasc 5 mg daily .   Consults: None  Significant Diagnostic Studies: labs: hgb at discharge 9.6  Treatments: magnesium sulfate for seizure prophylaxis and Vacuum assisted vaginal delivery    Disposition: Discharge disposition: 01-Home or Self Care       Discharge Instructions    Activity as tolerated   Complete by: As directed    Call MD for:  difficulty breathing, headache or visual disturbances   Complete by: As directed    Call MD for:  persistant nausea and vomiting   Complete by: As directed    Call MD for:  severe uncontrolled pain   Complete by: As directed    Call MD for:  temperature >100.4   Complete by: As directed    Diet - low sodium heart healthy   Complete by: As directed    No wound care   Complete by: As directed    Sexual acrtivity   Complete by: As directed    Avoid sex for 6 weeks     Allergies as of 05/27/2018   No Known Allergies     Medication List    STOP taking these medications   acebutolol 200 MG capsule Commonly known as: SECTRAL   cholecalciferol 25 MCG (1000 UT) tablet Commonly known as: VITAMIN D3   diphenhydramine-acetaminophen 25-500 MG Tabs tablet Commonly known as: TYLENOL PM   GABAPENTIN PO   hydrOXYzine 25 MG capsule Commonly known as: VISTARIL   zolpidem 5 MG tablet Commonly known as: Ambien      TAKE these medications   amLODipine 5 MG tablet Commonly known as: NORVASC Take 1 tablet (5 mg total) by mouth daily.   cyclobenzaprine 10 MG tablet Commonly known as: FLEXERIL Take 1 tablet (10 mg total) by mouth 2 (two) times daily as needed for muscle spasms.   ibuprofen 600 MG tablet Commonly known as: ADVIL Take 1 tablet (600 mg total) by mouth every 6 (six) hours as needed.   IRON PO Take by mouth.   labetalol 200 MG tablet Commonly known as: NORMODYNE Take 2 tablets (400 mg total) by mouth every 8 (eight) hours. What changed:   how much to take  when to take this   Potassium Chloride ER 20 MEQ Tbcr 1 po bid   PRENATAL+DHA PO Take 1 each by mouth daily.      Follow-up Information    Christophe Louis, MD. Schedule an appointment as soon as possible for a visit in 1 week(s).   Specialty: Obstetrics and Gynecology Why: BP check Contact information: [address] [place] [address] [phone]           Signed: Christophe Louis 06/16/2018, 1:18 PM

## 2019-03-16 ENCOUNTER — Ambulatory Visit: Payer: 59 | Admitting: Neurology

## 2019-06-24 IMAGING — US US MFM OB FOLLOW UP
1 series · 13 of 28 positions shown · non-contrast
Comparison: none

[Series 1: us mfm ob follow up · 45 acquisitions, 13 frames shown]
[im 2/45]
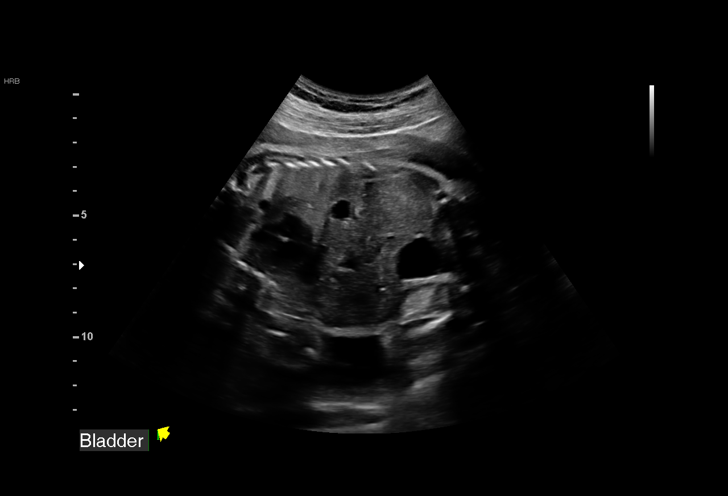
[im 5/45]
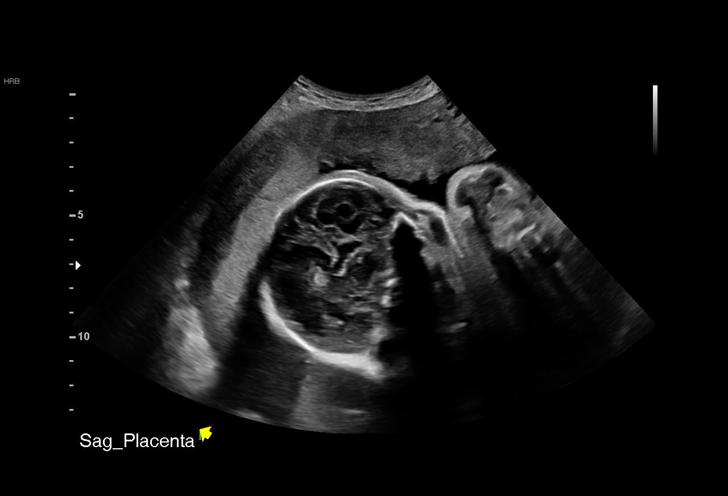
[im 9/45]
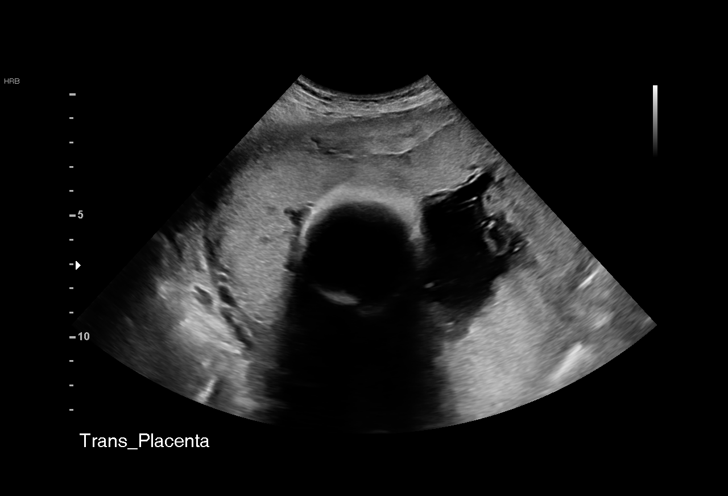
[im 12/45]
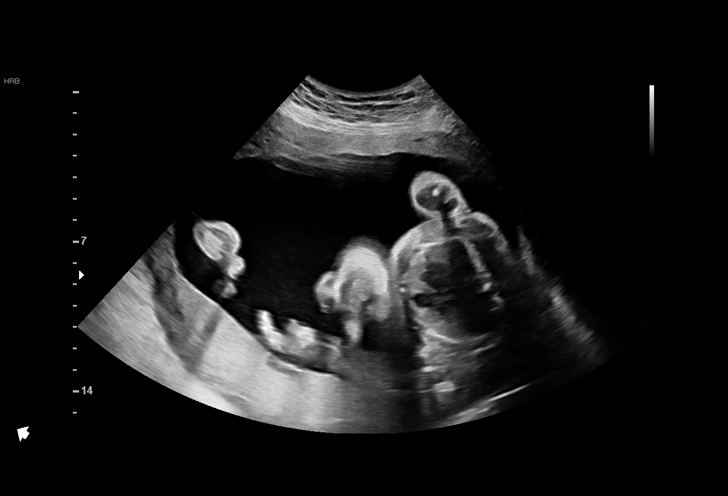
[im 15/45]
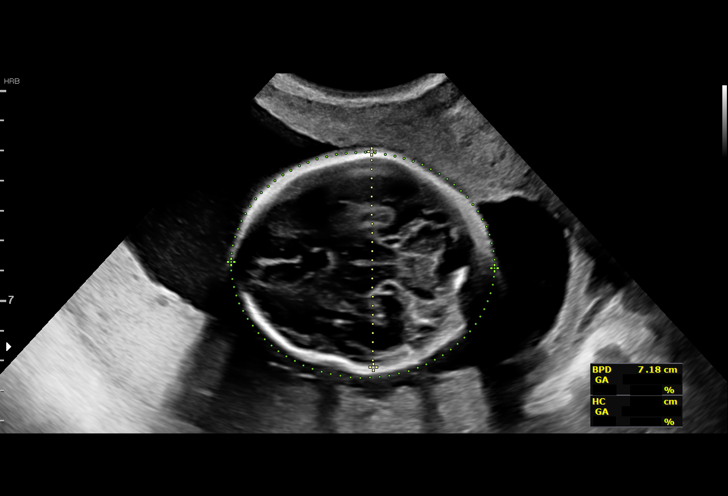
[im 18/45]
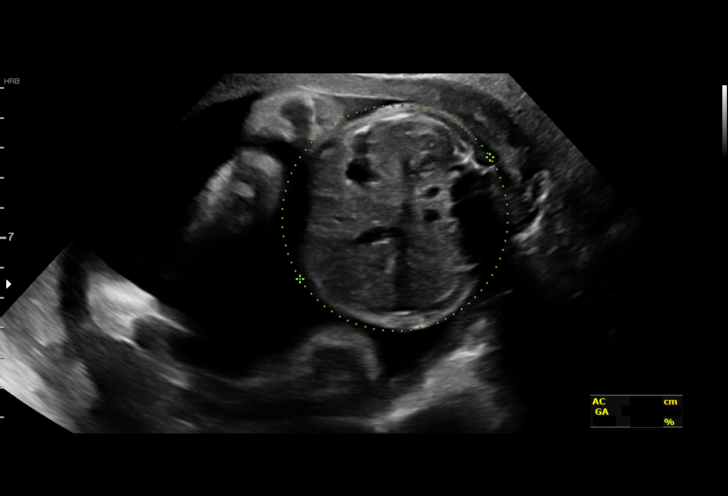
[im 23/45]
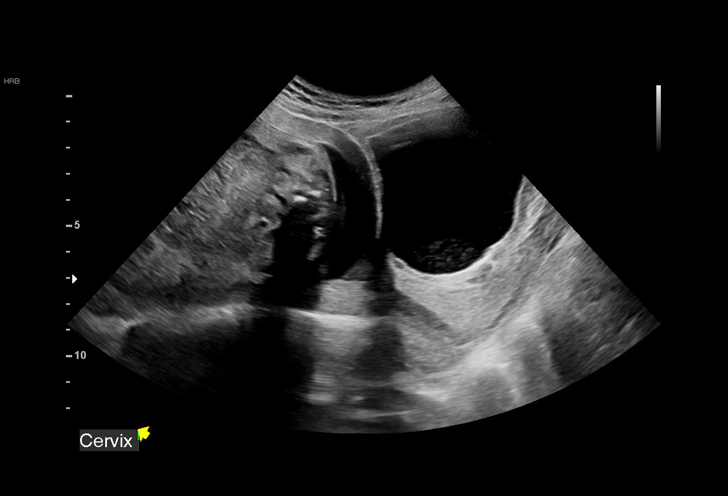
[im 27/45]
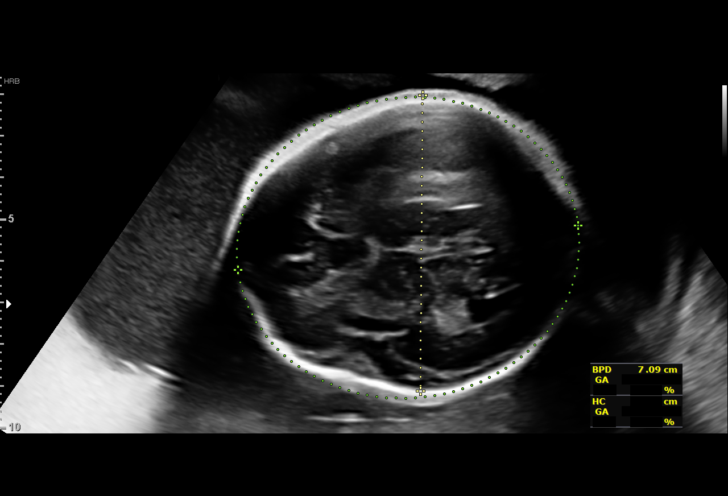
[im 30/45]
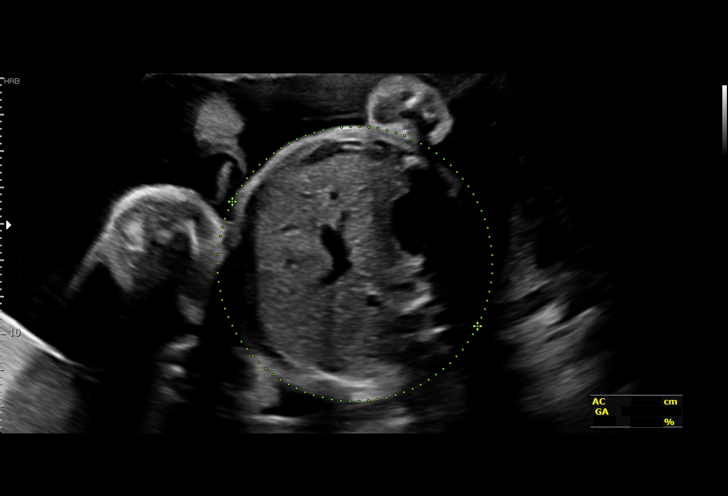
[im 33/45]
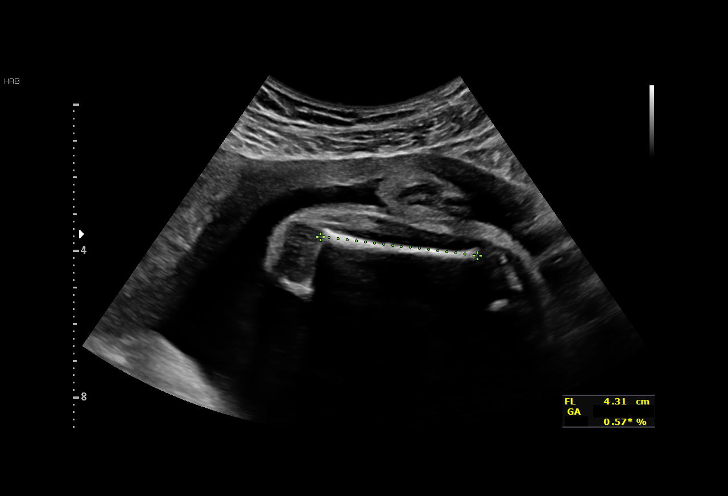
[im 36/45]
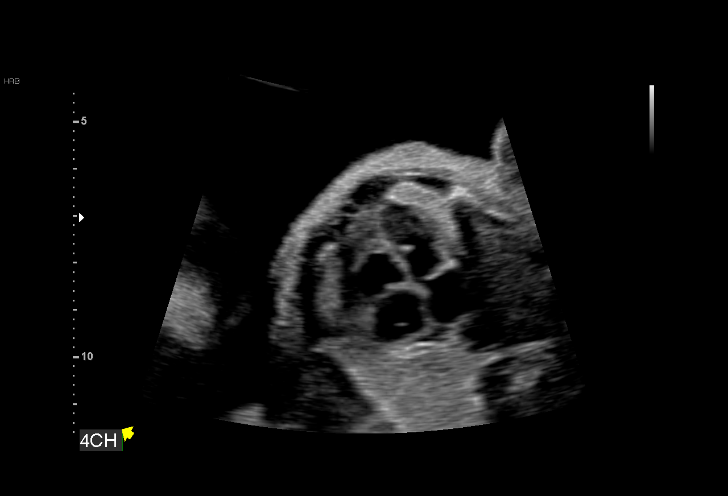
[im 40/45]
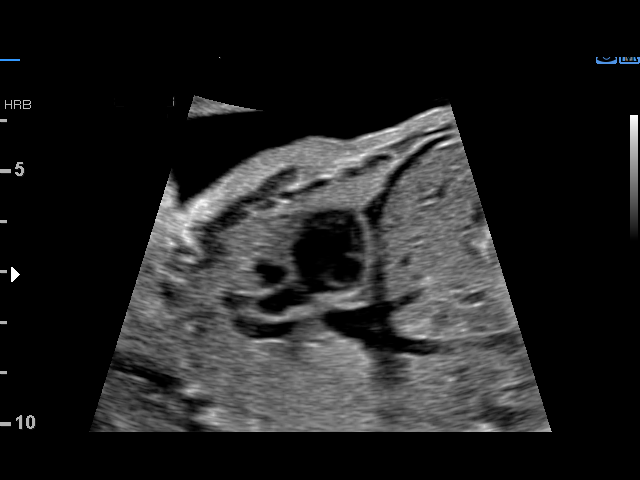
[im 43/45]
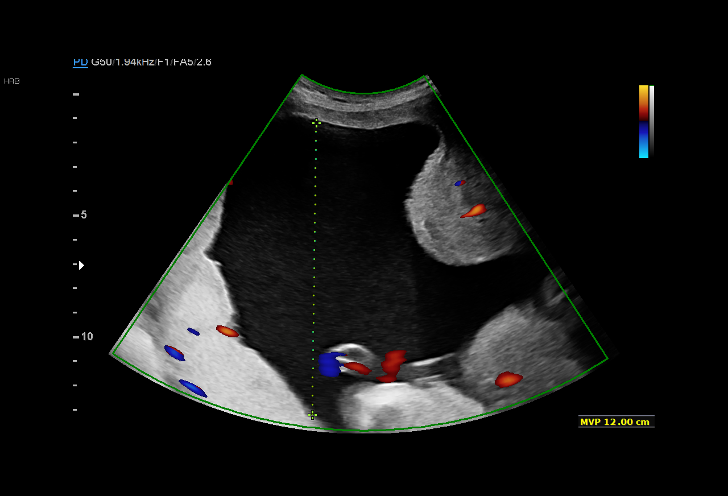

[13 of 28 positions shown; findings below may reference images not displayed]

[REDACTED]
                                                              Ave[address] [HOSPITAL]

 ----------------------------------------------------------------------

 ----------------------------------------------------------------------
Indications

  Advanced maternal age multigravida 35+,
  second trimester
  Abnormal biochemical screen (Panorama) for
  Trisomy 21
  Hypertension - Chronic/Pre-existing (labetalol)
  Poor obstetric history: Previous gestational
  diabetes
  Family history of chromosomal abnormality
  (sister with T21)
  Fetal Trisomy 21 affecting care of mother,
  antepartum (T21 by amnio)
  26 weeks gestation of pregnancy
 ----------------------------------------------------------------------
Vital Signs

 BMI:
Fetal Evaluation

 Num Of Fetuses:          1
 Fetal Heart Rate(bpm):   143
 Cardiac Activity:        Observed
 Presentation:            Breech
 Placenta:                Posterior
 P. Cord Insertion:       Previously Visualized
 Amniotic Fluid
 AFI FV:      Polyhydramnios

                             Largest Pocket(cm)

Biometry

 BPD:      70.4   mm     G. Age:  28w 2d         86  %    CI:         83.31  %    70 - 86
                                                          FL/HC:       17.8  %    18.6 -
 HC:      243.2   mm     G. Age:  26w 3d         17  %    HC/AC:       1.03       1.05 -
 AC:      236.6   mm     G. Age:  28w 0d         78  %    FL/BPD:      61.4  %    71 - 87
 FL:       43.2   mm     G. Age:  24w 1d        < 3  %    FL/AC:       18.3  %    20 - 24
 HUM:      38.6   mm     G. Age:  23w 5d        < 5  %

 LV:         5.2  mm

 Est. FW:     949   gm      2 lb 1 oz     50  %
Gestational Age

 LMP:            26w 5d       Date:  09/06/17                   EDD:  06/13/18
 U/S Today:      26w 5d                                         EDD:  06/13/18
 Best:           26w 5d    Det. By:  LMP  (09/06/17)            EDD:  06/13/18
Anatomy

 Cranium:                Appears normal         Aortic Arch:            Previously seen
 Cavum:                  Appears normal         Ductal Arch:            Previously seen
 Ventricles:             Appears normal         Diaphragm:              Appears normal
 Choroid Plexus:         Previously seen        Stomach:                Appears normal, left
                                                                        sided
 Cerebellum:             Previously seen        Abdomen:                Appears normal
 Posterior Fossa:        Previously seen        Abdominal Wall:         Previously seen
 Nuchal Fold:            Previously seen        Cord Vessels:           Previously seen
 Face:                   Orbits previously      Kidneys:                Previously seen
                         seen
 Lips:                   Previously seen        Bladder:                Appears normal
 Thoracic:               Appears normal         Spine:                  Previously seen
 Heart:                  Echogenic focus        Upper Extremities:      Previously seen
                         in LV
 RVOT:                   Appears normal         Lower Extremities:      Previously seen
                                                                        [employer]
 LVOT:                   Appears normal

 Other:   Fetus appears to be a male. Heels and 5th digit previously visualized.
          Technically difficult due to fetal position.
Cervix Uterus Adnexa

 Cervix
 Not visualized (advanced GA >85wks)
Comments

 I discussed with Ms. Mier regarding the new diagnosis of
 polyhydramnios. We discussed the etiology of polyhydramnios
 that includes aneuploidy, diabetes, idiopathic, fetal
 malformations and neurologic syndromes. We recommend
 follow up growth in cases where the AFI is <30 cm and initiate
 weekly testing if AFI increases >30 cm. Follow up growth is
 recommended at this time.
Impression

 Normal interval growth.
 Known Trisomy 21
 Normal echocardiogram
 New polyhydramnios
Recommendations

 Follow up growth and fluid in 4 weeks.
 Consider early glucola given prior history of GDM.

## 2022-05-02 ENCOUNTER — Ambulatory Visit: Payer: Managed Care, Other (non HMO) | Admitting: Internal Medicine

## 2022-05-22 ENCOUNTER — Ambulatory Visit: Payer: Self-pay | Admitting: Physician Assistant

## 2022-06-26 ENCOUNTER — Ambulatory Visit: Payer: Self-pay | Admitting: Physician Assistant

## 2022-10-30 DIAGNOSIS — F411 Generalized anxiety disorder: Secondary | ICD-10-CM | POA: Diagnosis not present

## 2022-10-30 DIAGNOSIS — F1120 Opioid dependence, uncomplicated: Secondary | ICD-10-CM | POA: Diagnosis not present

## 2022-10-30 DIAGNOSIS — F1111 Opioid abuse, in remission: Secondary | ICD-10-CM | POA: Diagnosis not present

## 2022-11-13 ENCOUNTER — Other Ambulatory Visit: Payer: Self-pay | Admitting: Family Medicine

## 2022-11-13 DIAGNOSIS — E785 Hyperlipidemia, unspecified: Secondary | ICD-10-CM | POA: Diagnosis not present

## 2022-11-13 DIAGNOSIS — Z23 Encounter for immunization: Secondary | ICD-10-CM | POA: Diagnosis not present

## 2022-11-13 DIAGNOSIS — G629 Polyneuropathy, unspecified: Secondary | ICD-10-CM | POA: Diagnosis not present

## 2022-11-13 DIAGNOSIS — E559 Vitamin D deficiency, unspecified: Secondary | ICD-10-CM | POA: Diagnosis not present

## 2022-11-13 DIAGNOSIS — Z1231 Encounter for screening mammogram for malignant neoplasm of breast: Secondary | ICD-10-CM

## 2022-11-13 DIAGNOSIS — I10 Essential (primary) hypertension: Secondary | ICD-10-CM | POA: Diagnosis not present

## 2022-11-27 DIAGNOSIS — F1120 Opioid dependence, uncomplicated: Secondary | ICD-10-CM | POA: Diagnosis not present

## 2022-11-27 DIAGNOSIS — F1111 Opioid abuse, in remission: Secondary | ICD-10-CM | POA: Diagnosis not present

## 2022-11-27 DIAGNOSIS — F1121 Opioid dependence, in remission: Secondary | ICD-10-CM | POA: Diagnosis not present

## 2023-01-01 ENCOUNTER — Ambulatory Visit: Payer: Managed Care, Other (non HMO)

## 2023-01-08 ENCOUNTER — Ambulatory Visit: Payer: Managed Care, Other (non HMO)

## 2023-02-20 DIAGNOSIS — F1120 Opioid dependence, uncomplicated: Secondary | ICD-10-CM | POA: Diagnosis not present

## 2023-02-20 DIAGNOSIS — F411 Generalized anxiety disorder: Secondary | ICD-10-CM | POA: Diagnosis not present

## 2023-02-20 DIAGNOSIS — F1111 Opioid abuse, in remission: Secondary | ICD-10-CM | POA: Diagnosis not present

## 2023-03-27 DIAGNOSIS — F411 Generalized anxiety disorder: Secondary | ICD-10-CM | POA: Diagnosis not present

## 2023-05-01 DIAGNOSIS — F411 Generalized anxiety disorder: Secondary | ICD-10-CM | POA: Diagnosis not present

## 2023-06-11 DIAGNOSIS — F411 Generalized anxiety disorder: Secondary | ICD-10-CM | POA: Diagnosis not present

## 2023-06-11 DIAGNOSIS — F1121 Opioid dependence, in remission: Secondary | ICD-10-CM | POA: Diagnosis not present

## 2023-06-11 DIAGNOSIS — F1111 Opioid abuse, in remission: Secondary | ICD-10-CM | POA: Diagnosis not present

## 2023-06-11 DIAGNOSIS — F1120 Opioid dependence, uncomplicated: Secondary | ICD-10-CM | POA: Diagnosis not present

## 2023-07-09 DIAGNOSIS — E559 Vitamin D deficiency, unspecified: Secondary | ICD-10-CM | POA: Diagnosis not present

## 2023-07-09 DIAGNOSIS — E785 Hyperlipidemia, unspecified: Secondary | ICD-10-CM | POA: Diagnosis not present

## 2023-07-09 DIAGNOSIS — I10 Essential (primary) hypertension: Secondary | ICD-10-CM | POA: Diagnosis not present

## 2023-07-09 DIAGNOSIS — Z1231 Encounter for screening mammogram for malignant neoplasm of breast: Secondary | ICD-10-CM | POA: Diagnosis not present

## 2023-07-10 ENCOUNTER — Other Ambulatory Visit: Payer: Self-pay | Admitting: Family Medicine

## 2023-07-10 DIAGNOSIS — Z1231 Encounter for screening mammogram for malignant neoplasm of breast: Secondary | ICD-10-CM

## 2023-07-17 DIAGNOSIS — F1111 Opioid abuse, in remission: Secondary | ICD-10-CM | POA: Diagnosis not present

## 2023-07-17 DIAGNOSIS — F1120 Opioid dependence, uncomplicated: Secondary | ICD-10-CM | POA: Diagnosis not present

## 2023-08-14 DIAGNOSIS — F1121 Opioid dependence, in remission: Secondary | ICD-10-CM | POA: Diagnosis not present

## 2023-08-14 DIAGNOSIS — F411 Generalized anxiety disorder: Secondary | ICD-10-CM | POA: Diagnosis not present

## 2023-08-16 ENCOUNTER — Ambulatory Visit
Admission: RE | Admit: 2023-08-16 | Discharge: 2023-08-16 | Disposition: A | Discharge status: Home / Self Care | Source: Ambulatory Visit | Attending: Family Medicine | Admitting: Family Medicine

## 2023-08-16 ENCOUNTER — Ambulatory Visit

## 2023-08-16 ENCOUNTER — Ambulatory Visit: Payer: Self-pay

## 2023-08-16 DIAGNOSIS — Z1231 Encounter for screening mammogram for malignant neoplasm of breast: Secondary | ICD-10-CM

## 2023-08-21 ENCOUNTER — Other Ambulatory Visit: Payer: Self-pay | Admitting: Family Medicine

## 2023-08-21 DIAGNOSIS — R928 Other abnormal and inconclusive findings on diagnostic imaging of breast: Secondary | ICD-10-CM

## 2023-08-27 ENCOUNTER — Ambulatory Visit
Admission: RE | Admit: 2023-08-27 | Discharge: 2023-08-27 | Disposition: A | Discharge status: Home / Self Care | Source: Ambulatory Visit | Attending: Family Medicine | Admitting: Family Medicine

## 2023-08-27 DIAGNOSIS — R928 Other abnormal and inconclusive findings on diagnostic imaging of breast: Secondary | ICD-10-CM

## 2023-08-27 DIAGNOSIS — N6002 Solitary cyst of left breast: Secondary | ICD-10-CM | POA: Diagnosis not present

## 2023-08-27 DIAGNOSIS — N6315 Unspecified lump in the right breast, overlapping quadrants: Secondary | ICD-10-CM | POA: Diagnosis not present

## 2023-09-26 DIAGNOSIS — F1120 Opioid dependence, uncomplicated: Secondary | ICD-10-CM | POA: Diagnosis not present

## 2023-09-26 DIAGNOSIS — F1121 Opioid dependence, in remission: Secondary | ICD-10-CM | POA: Diagnosis not present

## 2023-09-26 DIAGNOSIS — F1111 Opioid abuse, in remission: Secondary | ICD-10-CM | POA: Diagnosis not present

## 2023-09-26 DIAGNOSIS — F411 Generalized anxiety disorder: Secondary | ICD-10-CM | POA: Diagnosis not present

## 2023-11-06 DIAGNOSIS — F411 Generalized anxiety disorder: Secondary | ICD-10-CM | POA: Diagnosis not present

## 2023-11-06 DIAGNOSIS — F1121 Opioid dependence, in remission: Secondary | ICD-10-CM | POA: Diagnosis not present
